# Patient Record
Sex: Female | Born: 1980 | Race: Black or African American | Hispanic: No | Marital: Married | Smoking: Never smoker
Health system: Southern US, Community
[De-identification: ages and names within clinical notes are randomized; demographics above are authoritative.]

## PROBLEM LIST (undated history)

## (undated) DIAGNOSIS — R569 Unspecified convulsions: Secondary | ICD-10-CM

## (undated) DIAGNOSIS — E669 Obesity, unspecified: Secondary | ICD-10-CM

## (undated) HISTORY — DX: Unspecified convulsions: R56.9

## (undated) HISTORY — DX: Obesity, unspecified: E66.9

---

## 2008-05-06 DIAGNOSIS — O1490 Unspecified pre-eclampsia, unspecified trimester: Secondary | ICD-10-CM

## 2009-01-07 ENCOUNTER — Encounter (INDEPENDENT_AMBULATORY_CARE_PROVIDER_SITE_OTHER): Payer: Self-pay | Admitting: Obstetrics and Gynecology

## 2009-01-07 ENCOUNTER — Inpatient Hospital Stay (HOSPITAL_COMMUNITY): Admission: AD | Admit: 2009-01-07 | Discharge: 2009-01-11 | Payer: Self-pay | Admitting: Obstetrics and Gynecology

## 2009-08-15 ENCOUNTER — Emergency Department (HOSPITAL_COMMUNITY): Admission: EM | Admit: 2009-08-15 | Discharge: 2009-08-15 | Payer: Self-pay | Admitting: Family Medicine

## 2010-07-05 ENCOUNTER — Inpatient Hospital Stay (INDEPENDENT_AMBULATORY_CARE_PROVIDER_SITE_OTHER)
Admission: RE | Admit: 2010-07-05 | Discharge: 2010-07-05 | Disposition: A | Discharge status: Home / Self Care | Payer: Self-pay | Source: Ambulatory Visit | Attending: Family Medicine | Admitting: Family Medicine

## 2010-07-05 ENCOUNTER — Ambulatory Visit (INDEPENDENT_AMBULATORY_CARE_PROVIDER_SITE_OTHER): Payer: Self-pay

## 2010-07-05 DIAGNOSIS — S92919A Unspecified fracture of unspecified toe(s), initial encounter for closed fracture: Secondary | ICD-10-CM

## 2010-08-10 LAB — URIC ACID
Uric Acid, Serum: 6.5 mg/dL (ref 2.4–7.0)
Uric Acid, Serum: 6.9 mg/dL (ref 2.4–7.0)

## 2010-08-10 LAB — COMPREHENSIVE METABOLIC PANEL
ALT: 10 U/L (ref 0–35)
AST: 15 U/L (ref 0–37)
AST: 21 U/L (ref 0–37)
Alkaline Phosphatase: 104 U/L (ref 39–117)
Alkaline Phosphatase: 142 U/L — ABNORMAL HIGH (ref 39–117)
BUN: 6 mg/dL (ref 6–23)
CO2: 24 mEq/L (ref 19–32)
CO2: 25 mEq/L (ref 19–32)
CO2: 27 mEq/L (ref 19–32)
Calcium: 8.3 mg/dL — ABNORMAL LOW (ref 8.4–10.5)
Calcium: 8.3 mg/dL — ABNORMAL LOW (ref 8.4–10.5)
Chloride: 107 mEq/L (ref 96–112)
Creatinine, Ser: 0.78 mg/dL (ref 0.4–1.2)
GFR calc Af Amer: >60 mL/min (ref >60)
GFR calc Af Amer: >60 mL/min (ref >60)
GFR calc non Af Amer: >60 mL/min (ref >60)
GFR calc non Af Amer: >60 mL/min (ref >60)
GFR calc non Af Amer: >60 mL/min (ref >60)
Glucose, Bld: 109 mg/dL — ABNORMAL HIGH (ref 70–99)
Glucose, Bld: 81 mg/dL (ref 70–99)
Glucose, Bld: 90 mg/dL (ref 70–99)
Potassium: 3.7 mEq/L (ref 3.5–5.1)
Sodium: 138 mEq/L (ref 135–145)
Total Bilirubin: 0.4 mg/dL (ref 0.3–1.2)
Total Protein: 4.9 g/dL — ABNORMAL LOW (ref 6.0–8.3)

## 2010-08-10 LAB — LACTATE DEHYDROGENASE
LDH: 116 U/L (ref 94–250)
LDH: 124 U/L (ref 94–250)
LDH: 162 U/L (ref 94–250)

## 2010-08-10 LAB — MAGNESIUM: Magnesium: 5.3 mg/dL — ABNORMAL HIGH (ref 1.5–2.5)

## 2010-08-10 LAB — URINALYSIS, MICROSCOPIC ONLY
Ketones, ur: 15 mg/dL — AB
Leukocytes, UA: NEGATIVE
Nitrite: NEGATIVE
Protein, ur: 30 mg/dL — AB
Urobilinogen, UA: >8 mg/dL — ABNORMAL HIGH (ref 0.0–1.0)

## 2010-08-10 LAB — CBC
HCT: 30.6 % — ABNORMAL LOW (ref 36.0–46.0)
HCT: 33.4 % — ABNORMAL LOW (ref 36.0–46.0)
HCT: 37.7 % (ref 36.0–46.0)
Hemoglobin: 10.2 g/dL — ABNORMAL LOW (ref 12.0–15.0)
Hemoglobin: 11.2 g/dL — ABNORMAL LOW (ref 12.0–15.0)
Hemoglobin: 12.7 g/dL (ref 12.0–15.0)
MCHC: 33.5 g/dL (ref 30.0–36.0)
MCHC: 33.6 g/dL (ref 30.0–36.0)
MCHC: 33.9 g/dL (ref 30.0–36.0)
MCV: 88.3 fL (ref 78.0–100.0)
Platelets: 200 10*3/uL (ref 150–400)
RBC: 3.4 MIL/uL — ABNORMAL LOW (ref 3.87–5.11)
RBC: 4.33 MIL/uL (ref 3.87–5.11)
RDW: 14.7 % (ref 11.5–15.5)
RDW: 14.8 % (ref 11.5–15.5)
RDW: 15.3 % (ref 11.5–15.5)
WBC: 11.3 10*3/uL — ABNORMAL HIGH (ref 4.0–10.5)
WBC: 14.6 10*3/uL — ABNORMAL HIGH (ref 4.0–10.5)

## 2010-08-10 LAB — BASIC METABOLIC PANEL
BUN: 7 mg/dL (ref 6–23)
Calcium: 8.1 mg/dL — ABNORMAL LOW (ref 8.4–10.5)
GFR calc non Af Amer: >60 mL/min (ref >60)
Potassium: 3.5 mEq/L (ref 3.5–5.1)

## 2010-08-10 LAB — PROTEIN, URINE, RANDOM: Total Protein, Urine: 6 mg/dL

## 2012-05-01 ENCOUNTER — Emergency Department (INDEPENDENT_AMBULATORY_CARE_PROVIDER_SITE_OTHER)
Admission: EM | Admit: 2012-05-01 | Discharge: 2012-05-01 | Disposition: A | Discharge status: Home / Self Care | Payer: Self-pay | Source: Home / Self Care | Attending: Family Medicine | Admitting: Family Medicine

## 2012-05-01 ENCOUNTER — Encounter (HOSPITAL_COMMUNITY): Payer: Self-pay

## 2012-05-01 DIAGNOSIS — T23269A Burn of second degree of back of unspecified hand, initial encounter: Secondary | ICD-10-CM

## 2012-05-01 MED ORDER — HYDROCODONE-ACETAMINOPHEN 5-325 MG PO TABS: 1.0000 | ORAL_TABLET | ORAL | Status: DC | PRN

## 2012-05-01 MED ORDER — SILVER SULFADIAZINE 1 % EX CREA: TOPICAL_CREAM | Freq: Every day | CUTANEOUS | Status: DC
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
## 2012-09-01 ENCOUNTER — Emergency Department (HOSPITAL_COMMUNITY)
Admission: EM | Admit: 2012-09-01 | Discharge: 2012-09-01 | Disposition: A | Discharge status: Home / Self Care | Payer: Self-pay | Attending: Emergency Medicine | Admitting: Emergency Medicine

## 2012-09-01 ENCOUNTER — Encounter (HOSPITAL_COMMUNITY): Payer: Self-pay | Admitting: Emergency Medicine

## 2012-09-01 DIAGNOSIS — R569 Unspecified convulsions: Secondary | ICD-10-CM | POA: Insufficient documentation

## 2012-09-01 DIAGNOSIS — Z3202 Encounter for pregnancy test, result negative: Secondary | ICD-10-CM | POA: Insufficient documentation

## 2012-09-01 DIAGNOSIS — Z880 Allergy status to penicillin: Secondary | ICD-10-CM | POA: Insufficient documentation

## 2012-09-01 LAB — URINALYSIS, ROUTINE W REFLEX MICROSCOPIC
Glucose, UA: NEGATIVE mg/dL
Leukocytes, UA: NEGATIVE
Protein, ur: 30 mg/dL — AB
Specific Gravity, Urine: 1.017 (ref 1.005–1.030)
Urobilinogen, UA: 0.2 mg/dL (ref 0.0–1.0)

## 2012-09-01 LAB — POCT PREGNANCY, URINE: Preg Test, Ur: NEGATIVE

## 2012-09-01 LAB — BASIC METABOLIC PANEL
CO2: 27 mEq/L (ref 19–32)
Calcium: 9.4 mg/dL (ref 8.4–10.5)
GFR calc non Af Amer: >90 mL/min (ref >90)
Sodium: 135 mEq/L (ref 135–145)

## 2012-09-01 LAB — CBC WITH DIFFERENTIAL/PLATELET
Lymphocytes Relative: 18 % (ref 12–46)
Lymphs Abs: 1.9 10*3/uL (ref 0.7–4.0)
Neutrophils Relative %: 75 % (ref 43–77)
Platelets: 324 10*3/uL (ref 150–400)
RBC: 5.29 MIL/uL — ABNORMAL HIGH (ref 3.87–5.11)
WBC: 10.7 10*3/uL — ABNORMAL HIGH (ref 4.0–10.5)

## 2012-09-01 LAB — URINE MICROSCOPIC-ADD ON

## 2012-09-01 NOTE — ED Notes (Addendum)
Pt brought to ED via [facility] for evaluation of possible seizure.  Witnessed by family pt stood up from couch and then fell backwards and put her arms infront of her- family reports pt seized for about 2 minutes- pt has no history of seizures.  EMS reports pt to be post-ictal for about 30 minutes after seizure.  Pt reports feeling tired earlier today, does not remember incident prior to arrival.  IV in place, seizure precautions in place.  Dr. Jeraldine Loots at bedside.  Pt states she has been staying up late studying for finals.  Alert and oriented X 4 at present.
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
## 2012-09-03 ENCOUNTER — Ambulatory Visit (INDEPENDENT_AMBULATORY_CARE_PROVIDER_SITE_OTHER): Payer: Self-pay | Admitting: Neurology

## 2012-09-03 ENCOUNTER — Telehealth: Payer: Self-pay | Admitting: Neurology

## 2012-09-03 ENCOUNTER — Encounter: Payer: Self-pay | Admitting: Neurology

## 2012-09-03 ENCOUNTER — Other Ambulatory Visit (INDEPENDENT_AMBULATORY_CARE_PROVIDER_SITE_OTHER): Payer: Self-pay

## 2012-09-03 VITALS — BP 140/99 | HR 69 | Ht 59.0 in | Wt 194.0 lb

## 2012-09-03 DIAGNOSIS — R569 Unspecified convulsions: Secondary | ICD-10-CM

## 2012-09-03 DIAGNOSIS — Z0289 Encounter for other administrative examinations: Secondary | ICD-10-CM

## 2012-09-03 HISTORY — DX: Unspecified convulsions: R56.9

## 2012-09-03 MED ORDER — LEVETIRACETAM 500 MG PO TABS: 500.0000 mg | ORAL_TABLET | Freq: Two times a day (BID) | ORAL | Status: DC

## 2012-09-03 MED ORDER — LEVETIRACETAM 500 MG PO TABS: ORAL_TABLET | ORAL | Status: DC

## 2012-09-03 NOTE — Procedures (Signed)
Anne Benjamin is a 32 year old patient with a single seizure event that occurred as a witnessed event on 09/01/2012. The event occurred after the patient had been up most of the night before studying. The patient is being evaluated for the seizures.  This is a routine EEG. No skull defects are noted. Medications include multivitamins.  EEG classification: Dysrhythmia grade 2 left temporal  Description of the recording: The background rhythms of this recording consists of a fairly well modulated medium amplitude alpha rhythm of 8 Hz that is reactive to eye opening and closure. As the record progresses, the patient initially was in the waking state, and photic stimulation is performed. This results in a bilateral and symmetric photic driving response. Hyperventilation is also performed, and this results in a minimal buildup of the background rhythm activities without significant slowing seen. Towards the end of the recording, the patient appears to enter the drowsy state, with a dropout of the background rhythm activities and generalized 7 Hz frequency slowing seen. The patient never enters stage II sleep. Intermittently during the recording, dysrhythmic theta slowing is seen emanating from the left mid temporal region, with occasional sharp transients. At no time during the recording does there appear to be evidence of spike or spike wave discharges. EKG monitor reveals no evidence of cardiac rhythm abnormalities with a heart rate of 78.  Impression: This is an abnormal EEG recording secondary to left temporal dysrhythmic theta activity. 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 No electrographic seizures were recorded.

## 2012-09-03 NOTE — Telephone Encounter (Signed)
I called and I talked with the husband. The EEG study showed left brain irritability. The patient will be placed on Keppra. Eventually, a MRI the brain will be done. Unfortunately, they will be leading to go out of the country for 3 months. I have called in a 90 day prescription for the Keppra.

## 2012-09-03 NOTE — Patient Instructions (Addendum)
Seizure, Adult  A seizure is abnormal electrical activity in the brain. Seizures can cause a change in attention or behavior (altered mental status). Seizures often involve uncontrollable shaking (convulsions). Seizures usually last from 30 seconds to 2 minutes. Epilepsy is a brain disorder in which a patient has repeated seizures over time.  CAUSES   There are many different problems that can cause seizures. In some cases, no cause is found. Common causes of seizures include:   Head injuries.   Brain tumors.   Infections.   Imbalance of chemicals in the blood.   Kidney failure or liver failure.   Heart disease.   Drug abuse.   Stroke.   Withdrawal from certain drugs or alcohol.   Birth defects.   Malfunction of a neurosurgical device placed in the brain.  SYMPTOMS   Symptoms vary depending on the part of the brain that is involved. Right before a seizure, you may have a warning (aura) that a seizure is about to occur. An aura may include the following symptoms:    Fear or anxiety.   Nausea.   Feeling like the room is spinning (vertigo).   Vision changes, such as seeing flashing lights or spots.  Common symptoms during a seizure include:   Convulsions.   Drooling.   Rapid eye movements.   Grunting.   Loss of bladder and bowel control.   Bitter taste in the mouth.  After a seizure, you may feel confused and sleepy. You may also have an injury resulting from convulsions during the seizure.  DIAGNOSIS   Your caregiver will perform a physical exam and run some tests to determine the type and cause of your seizure. These tests may include:   Blood tests.   A lumbar puncture test. In this test, a small amount of fluid is removed from the spine and examined.   Electrocardiography (ECG). This test records the electrical activity in your heart.   Imaging tests, such as computed tomography (CT) scans or magnetic resonance imaging (MRI).   Electroencephalography (EEG). This test records the electrical  activity in your brain.  TREATMENT   Seizures usually stop on their own. Treatment will depend on the cause of your seizure. In some cases, medicine may be given to prevent future seizures.  HOME CARE INSTRUCTIONS    If you are given medicines, take them exactly as prescribed by your caregiver.   Keep all follow-up appointments as directed by your caregiver.   Do not swim or drive until your caregiver says it is okay.   Teach friends and family what to do if you have a seizure. They should:   Lay you on the ground to prevent a fall.   Put a cushion under your head.   Loosen any tight clothing around your neck.   Turn you on your side. If vomiting occurs, this helps keep your airway clear.   Stay with you until you recover.  SEEK IMMEDIATE MEDICAL CARE IF:   The seizure lasts longer than 2 to 5 minutes.   The seizure is severe or the person does not wake up after the seizure.   The person has altered mental status.  Drive the person to the emergency department or call your local emergency services (911 in [place]  MAKE SURE YOU:   Understand these instructions.   Will watch your condition.   Will get help right away if you are not doing well or get worse.  Document Released: 04/19/2000 Document Revised: 07/15/2011 Document   Reviewed: 04/10/2011  [employer] Patient Information 2013 [employer]
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
## 2013-01-27 ENCOUNTER — Other Ambulatory Visit: Payer: Self-pay

## 2013-02-04 ENCOUNTER — Other Ambulatory Visit: Payer: Self-pay

## 2013-02-10 ENCOUNTER — Ambulatory Visit: Payer: Self-pay | Admitting: Neurology

## 2013-02-22 ENCOUNTER — Other Ambulatory Visit: Payer: Self-pay

## 2014-02-24 ENCOUNTER — Emergency Department (HOSPITAL_COMMUNITY): Payer: 59

## 2014-02-24 ENCOUNTER — Encounter (HOSPITAL_COMMUNITY): Payer: Self-pay | Admitting: Emergency Medicine

## 2014-02-24 ENCOUNTER — Inpatient Hospital Stay (HOSPITAL_COMMUNITY)
Admission: EM | Admit: 2014-02-24 | Discharge: 2014-02-27 | DRG: 101 | Disposition: A | Discharge status: Home / Self Care | Payer: 59 | Attending: Internal Medicine | Admitting: Internal Medicine

## 2014-02-24 DIAGNOSIS — E669 Obesity, unspecified: Secondary | ICD-10-CM | POA: Diagnosis present

## 2014-02-24 DIAGNOSIS — A419 Sepsis, unspecified organism: Secondary | ICD-10-CM

## 2014-02-24 DIAGNOSIS — Z79899 Other long term (current) drug therapy: Secondary | ICD-10-CM | POA: Diagnosis not present

## 2014-02-24 DIAGNOSIS — R569 Unspecified convulsions: Secondary | ICD-10-CM

## 2014-02-24 DIAGNOSIS — R400 Somnolence: Secondary | ICD-10-CM

## 2014-02-24 DIAGNOSIS — Z6841 Body Mass Index (BMI) 40.0 and over, adult: Secondary | ICD-10-CM | POA: Diagnosis not present

## 2014-02-24 DIAGNOSIS — Z9119 Patient's noncompliance with other medical treatment and regimen: Secondary | ICD-10-CM | POA: Diagnosis present

## 2014-02-24 DIAGNOSIS — Z789 Other specified health status: Secondary | ICD-10-CM

## 2014-02-24 DIAGNOSIS — R4182 Altered mental status, unspecified: Secondary | ICD-10-CM

## 2014-02-24 DIAGNOSIS — G40901 Epilepsy, unspecified, not intractable, with status epilepticus: Secondary | ICD-10-CM | POA: Diagnosis present

## 2014-02-24 DIAGNOSIS — R509 Fever, unspecified: Secondary | ICD-10-CM

## 2014-02-24 LAB — URINALYSIS, ROUTINE W REFLEX MICROSCOPIC
Bilirubin Urine: NEGATIVE
GLUCOSE, UA: NEGATIVE mg/dL
KETONES UR: 40 mg/dL — AB
Leukocytes, UA: NEGATIVE
Nitrite: NEGATIVE
PROTEIN: NEGATIVE mg/dL
Specific Gravity, Urine: 1.021 (ref 1.005–1.030)
UROBILINOGEN UA: 0.2 mg/dL (ref 0.0–1.0)
pH: 5 (ref 5.0–8.0)

## 2014-02-24 LAB — BASIC METABOLIC PANEL
Anion gap: 15 (ref 5–15)
BUN: 8 mg/dL (ref 6–23)
CHLORIDE: 100 meq/L (ref 96–112)
CO2: 22 mEq/L (ref 19–32)
Calcium: 9.2 mg/dL (ref 8.4–10.5)
Creatinine, Ser: 0.74 mg/dL (ref 0.50–1.10)
GFR calc Af Amer: >90 mL/min (ref >90)
GLUCOSE: 114 mg/dL — AB (ref 70–99)
POTASSIUM: 4.1 meq/L (ref 3.7–5.3)
SODIUM: 137 meq/L (ref 137–147)

## 2014-02-24 LAB — RAPID URINE DRUG SCREEN, HOSP PERFORMED
AMPHETAMINES: NOT DETECTED
BENZODIAZEPINES: NOT DETECTED
Barbiturates: NOT DETECTED
Cocaine: NOT DETECTED
OPIATES: NOT DETECTED
TETRAHYDROCANNABINOL: NOT DETECTED

## 2014-02-24 LAB — CBC
HCT: 45 % (ref 36.0–46.0)
HEMOGLOBIN: 15.2 g/dL — AB (ref 12.0–15.0)
MCH: 27.8 pg (ref 26.0–34.0)
MCHC: 33.8 g/dL (ref 30.0–36.0)
MCV: 82.4 fL (ref 78.0–100.0)
PLATELETS: 341 10*3/uL (ref 150–400)
RBC: 5.46 MIL/uL — AB (ref 3.87–5.11)
RDW: 14.3 % (ref 11.5–15.5)
WBC: 17.2 10*3/uL — AB (ref 4.0–10.5)

## 2014-02-24 LAB — CBG MONITORING, ED: Glucose-Capillary: 116 mg/dL — ABNORMAL HIGH (ref 70–99)

## 2014-02-24 LAB — URINE MICROSCOPIC-ADD ON

## 2014-02-24 LAB — PREGNANCY, URINE: Preg Test, Ur: NEGATIVE

## 2014-02-24 MED ORDER — SODIUM CHLORIDE 0.9 % IV SOLN
1000.0000 mg | Freq: Once | INTRAVENOUS | Status: AC
  Administered 2014-02-24: 1000 mg via INTRAVENOUS
  Filled 2014-02-24: qty 20

## 2014-02-24 MED ORDER — LORAZEPAM 2 MG/ML IJ SOLN
2.0000 mg | Freq: Once | INTRAMUSCULAR | Status: AC
  Administered 2014-02-24: 2 mg via INTRAVENOUS
  Filled 2014-02-24: qty 1

## 2014-02-24 MED ORDER — FENTANYL CITRATE 0.05 MG/ML IJ SOLN
100.0000 ug | Freq: Once | INTRAMUSCULAR | Status: AC
Start: 2014-02-24 — End: 2014-02-24
  Administered 2014-02-24: 25 ug via INTRAVENOUS
  Filled 2014-02-24: qty 2

## 2014-02-24 MED ORDER — PIPERACILLIN-TAZOBACTAM 3.375 G IVPB 30 MIN
3.3750 g | Freq: Once | INTRAVENOUS | Status: AC
  Administered 2014-02-24: 3.375 g via INTRAVENOUS
  Filled 2014-02-24: qty 50

## 2014-02-24 MED ORDER — DEXTROSE 5 % IV SOLN
10.0000 mg/kg | Freq: Once | INTRAVENOUS | Status: AC
  Administered 2014-02-25: 455 mg via INTRAVENOUS
  Filled 2014-02-24 (×2): qty 9.1

## 2014-02-24 MED ORDER — ACETAMINOPHEN 325 MG PO TABS
650.0000 mg | ORAL_TABLET | Freq: Once | ORAL | Status: AC
  Administered 2014-02-24: 650 mg via ORAL
  Filled 2014-02-24: qty 2

## 2014-02-24 MED ORDER — VANCOMYCIN HCL IN DEXTROSE 1-5 GM/200ML-% IV SOLN
1000.0000 mg | Freq: Once | INTRAVENOUS | Status: AC
Start: 2014-02-24 — End: 2014-02-24
  Administered 2014-02-24: 1000 mg via INTRAVENOUS
  Filled 2014-02-24: qty 200

## 2014-02-24 NOTE — ED Notes (Signed)
MD informed of pt's temperature. LP tray to bedside.

## 2014-02-24 NOTE — ED Notes (Signed)
Dr Stewart at bedside.

## 2014-02-24 NOTE — ED Notes (Signed)
MD at bedside. 

## 2014-02-24 NOTE — ED Notes (Signed)
Neuro at BS.

## 2014-02-24 NOTE — H&P (Signed)
[facility]                                                                                    Patient Demographics  Anne Benjamin, is a 33 y.o. female  CSN: [id number]  MRN: [id number]  DOB - January 26, 1981  Admit Date - 02/24/2014  Outpatient Primary MD for the patient is No PCP Per Patient   With History of -  Past Medical History  Diagnosis Date  . Seizures   . Convulsions/seizures 09/03/2012  . Obesity       Past Surgical History  Procedure Laterality Date  . Cesarean section      in for   Chief Complaint  Patient presents with  . Seizures     HPI  Anne Benjamin  is a 33 y.o. female, post medical history significant for seizure disorder on Keppra, presenting with 4 episodes of witnessed seizures that started this afternoon. The patient was diagnosed to have seizures few years ago and was started on Keppra 500 mg twice a day however she cut the dose in 1/4 and was taken 250 mg by mouth daily due to intolerance. Patient is still groggy and did not wake up completely in the emergency room yet. In the emergency room the patient was noted to have fever and leukocytosis so an LP was attempted and unsuccessful and an LP was ordered by radiology . Patient was started on IV antibiotics and anti-viral medications and was placed in isolation . Neurology was consulted    Review of Systems    Unable to obtain due to patient's condition  Social History History  Substance Use Topics  . Smoking status: Never Smoker   . Smokeless tobacco: Not on file  . Alcohol Use: No     Comment: rare     Family History Family History  Problem Relation Age of Onset  . Hypertension Mother      Prior to Admission medications   Medication Sig Start Date End Date Taking? Authorizing Provider  levETIRAcetam (KEPPRA) 500 MG tablet Take 1 tablet (500 mg total) by mouth 2 (two) times daily. 09/03/12  Yes Kathrynn Ducking, MD  Multiple Vitamin (MULTIVITAMIN) tablet Take 1 tablet by  mouth daily.   Yes Historical Provider, MD    Allergies  Allergen Reactions  . Orange Oil     unknown  . Penicillins     unknown    Physical Exam  Vitals  Blood pressure 109/70, pulse 119, temperature 101.2 F (38.4 C), temperature source Oral, resp. rate 23, height 5' (1.524 m), weight 81.647 kg (180 lb), last menstrual period 01/27/2014, SpO2 95.00%.   1. General Young female, well developed, well nourished, drowsy but arousable  2. confused.  3. No F.N deficits grossly, ALL C.Nerves Intact,  4. Ears and Eyes appear Normal, Conjunctivae clear, PERRLA. Moist Oral Mucosa.  5. Supple Neck, No JVD, No cervical lymphadenopathy appriciated, No Carotid Bruits.  6. Symmetrical Chest wall movement, Good air movement bilaterally, CTAB.  7. RRR, tachycardic No Gallops, Rubs or Murmurs, No Parasternal Heave.  8. Positive Bowel Sounds, Abdomen Soft, Non tender, No organomegaly appriciated,No rebound -guarding or rigidity.  9.  No Cyanosis, Normal Skin Turgor, No Skin Rash or Bruise.  10. Good muscle tone,  joints appear normal , no effusions, Normal ROM.  11. No Palpable Lymph Nodes in Neck or Axillae    Data Review  CBC  Recent Labs Lab 02/24/14 1735  WBC 17.2*  HGB 15.2*  HCT 45.0  PLT 341  MCV 82.4  MCH 27.8  MCHC 33.8  RDW 14.3   ------------------------------------------------------------------------------------------------------------------  Chemistries   Recent Labs Lab 02/24/14 1735  NA 137  K 4.1  CL 100  CO2 22  GLUCOSE 114*  BUN 8  CREATININE 0.74  CALCIUM 9.2   ------------------------------------------------------------------------------------------------------------------ estimated creatinine clearance is 92.8 ml/min (by C-G formula based on Cr of 0.74). ------------------------------------------------------------------------------------------------------------------ No results found for this basename: TSH, T4TOTAL, FREET3, T3FREE,  THYROIDAB,  in the last 72 hours   Coagulation profile No results found for this basename: INR, PROTIME,  in the last 168 hours ------------------------------------------------------------------------------------------------------------------- No results found for this basename: DDIMER,  in the last 72 hours -------------------------------------------------------------------------------------------------------------------  Cardiac Enzymes No results found for this basename: CK, CKMB, TROPONINI, MYOGLOBIN,  in the last 168 hours ------------------------------------------------------------------------------------------------------------------ No components found with this basename: POCBNP,    ---------------------------------------------------------------------------------------------------------------  Urinalysis    Component Value Date/Time   COLORURINE YELLOW 02/24/2014 2033   APPEARANCEUR CLOUDY* 02/24/2014 2033   LABSPEC 1.021 02/24/2014 2033   PHURINE 5.0 02/24/2014 2033   [place] 02/24/2014 2033   HGBUR TRACE* 02/24/2014 2033   [place] NEGATIVE 02/24/2014 2033   KETONESUR 40* 02/24/2014 2033   PROTEINUR NEGATIVE 02/24/2014 2033   UROBILINOGEN 0.2 02/24/2014 2033   NITRITE NEGATIVE 02/24/2014 2033   LEUKOCYTESUR NEGATIVE 02/24/2014 2033    ----------------------------------------------------------------------------------------------------------------  .   Imaging results:   Ct Head Wo Contrast  02/24/2014   CLINICAL DATA:  33 year old female with for seizures today and fever. Initial encounter.  EXAM: CT HEAD WITHOUT CONTRAST  TECHNIQUE: Contiguous axial images were obtained from the base of the skull through the vertex without intravenous contrast.  COMPARISON:  None.  FINDINGS: A trace bubbly opacity in the right sphenoid sinus. Other Visualized paranasal sinuses and mastoids are clear. No osseous abnormality identified. Dysconjugate gaze, otherwise  negative orbits soft tissues. Visualized scalp soft tissues are within normal limits.  Cerebral volume is normal. No midline shift, ventriculomegaly, mass effect, evidence of mass lesion, intracranial hemorrhage or evidence of cortically based acute infarction. Gray-white matter differentiation is within normal limits throughout the brain. No suspicious intracranial vascular hyperdensity.  IMPRESSION: Normal noncontrast CT appearance of the brain.   Electronically Signed   By: Lars Pinks M.D.   On: 02/24/2014 21:17    My personal review of EKG: Sinus tachycardia at 133 beats per minute with no acute changes    Assessment & Plan  1. seizure/status epilepticus 2. Fever 3. Noncompliance  Plan  Lumbar puncture By the radiology and follow results IV antibiotics with IV acyclovir Keep in isolation IV Dilantin IV Ativan when necessary Neurology following   DVT Prophylaxis  Lovenox after LP  AM Labs Ordered, also please review Full Orders  Family Communication: Admission, patients condition and plan of care including tests being ordered have been discussed with the  husband  who indicates understanding and agrese with the plan and Code Status.  Code Statusfull  Disposition Plan:Home  Time spent in minutes : 35 minutes  Condition  critical   @SIGNATURE @

## 2014-02-24 NOTE — ED Notes (Addendum)
Pt reports she has an allergy to Penicillin and it causes nausea and vomiting. This RN called pharmacy to ensure Zosyn and Vancomycin are okay to give due to sensitivity to PCN. Pharmacy reported it was okay to administer to patient. Pt reported she does NOT experience any throat swelling or closing if she takes PCN.

## 2014-02-24 NOTE — ED Notes (Signed)
Family states pt had 4 seizures today.  Hx of such.  Last in July. Pt has been taking 0.5 pill of Leveiracetam once a day.  1st seizure at 0430, last seizure at 1530.  Pt ao x 4.  Pt states she is exhausted, but denies pain.

## 2014-02-24 NOTE — ED Notes (Signed)
Admitting MD at BS.  

## 2014-02-24 NOTE — ED Provider Notes (Signed)
CSN: [id number]     Arrival date & time 02/24/14  1700 History   First MD Initiated Contact with Patient 02/24/14 1728     Chief Complaint  Patient presents with  . Seizures     (Consider location/radiation/quality/duration/timing/severity/associated sxs/prior Treatment) HPI Comments: 33 year old female with a history of seizures over the last year, she has been on Keppra, she is only taking 250 mg once a day. She states that over the course of the last 24 hours she has had 4 separate tonic-clonic seizures each lasting several minutes and resolving spontaneously. She has associated tongue biting and some urinary incontinence and now has a post ictal phase with a headache. There has been no other symptoms including no fevers chills nausea vomiting diarrhea swelling rashes coughing shortness of breath. She has not missed any of her medications. She reports that she started taking Keppra approximately one year ago but did not likely made her feel so she tapered back to 250 mg once a day.  Patient is a 33 y.o. female presenting with seizures. The history is provided by the patient.  Seizures   Past Medical History  Diagnosis Date  . Seizures   . Convulsions/seizures 09/03/2012  . Obesity    Past Surgical History  Procedure Laterality Date  . Cesarean section     Family History  Problem Relation Age of Onset  . Hypertension Mother    History  Substance Use Topics  . Smoking status: Never Smoker   . Smokeless tobacco: Not on file  . Alcohol Use: No     Comment: rare   OB History   Grav Para Term Preterm Abortions TAB SAB Ect Mult Living                 Review of Systems  Neurological: Positive for seizures.  All other systems reviewed and are negative.     Allergies  Orange oil and Penicillins  Home Medications   Prior to Admission medications   Medication Sig Start Date End Date Taking? Authorizing Provider  levETIRAcetam (KEPPRA) 500 MG tablet Take 1 tablet (500 mg  total) by mouth 2 (two) times daily. 09/03/12  Yes Kathrynn Ducking, MD  Multiple Vitamin (MULTIVITAMIN) tablet Take 1 tablet by mouth daily.   Yes Historical Provider, MD   BP 116/71  Pulse 127  Temp(Src) 101.2 F (38.4 C) (Oral)  Resp 21  Ht 5' (1.524 m)  Wt 180 lb (81.647 kg)  BMI 35.15 kg/m2  SpO2 99%  LMP 01/27/2014 Physical Exam  Nursing note and vitals reviewed. Constitutional: She appears well-developed and well-nourished. No distress.  HENT:  Head: Normocephalic and atraumatic.  Mouth/Throat: Oropharynx is clear and moist. No oropharyngeal exudate.  Evidence of biting to the distal tongue on the right, teeth intact  Eyes: Conjunctivae and EOM are normal. Pupils are equal, round, and reactive to light. Right eye exhibits no discharge. Left eye exhibits no discharge. No scleral icterus.  Neck: Normal range of motion. Neck supple. No JVD present. No thyromegaly present.  Cardiovascular: Regular rhythm, normal heart sounds and intact distal pulses.  Exam reveals no gallop and no friction rub.   No murmur heard. Mild tachycardia  Pulmonary/Chest: Effort normal and breath sounds normal. No respiratory distress. She has no wheezes. She has no rales.  Abdominal: Soft. Bowel sounds are normal. She exhibits no distension and no mass. There is no tenderness.  Musculoskeletal: Normal range of motion. She exhibits no edema and no tenderness.  Lymphadenopathy:  She has no cervical adenopathy.  Neurological: She is alert. Coordination normal.  Speech is clear, cranial nerves III through XII are intact, memory is intact, strength is normal in all 4 extremities including grips, sensation is intact to light touch and pinprick in all 4 extremities. Coordination as tested by finger-nose-finger is normal, no limb ataxia.  normal reflexes at the patellar tendons bilaterally  Skin: Skin is warm and dry. No rash noted. No erythema.  Psychiatric: She has a normal mood and affect. Her behavior is  normal.    ED Course  LUMBAR PUNCTURE Date/Time: 02/24/2014 10:27 PM Performed by: Noemi Chapel D Authorized by: Noemi Chapel D Consent: Verbal consent obtained. written consent obtained. Risks and benefits: risks, benefits and alternatives were discussed Consent given by: spouse Patient understanding: patient states understanding of the procedure being performed Patient consent: the patient's understanding of the procedure matches consent given Required items: required blood products, implants, devices, and special equipment available Patient identity confirmed: arm band Time out: Immediately prior to procedure a "time out" was called to verify the correct patient, procedure, equipment, support staff and site/side marked as required. Indications: evaluation for infection and evaluation for altered mental status Anesthesia: local infiltration Local anesthetic: lidocaine 1% with epinephrine Anesthetic total: 10 ml Patient sedated: no Preparation: Patient was prepped and draped in the usual sterile fashion. Lumbar space: L3-L4 interspace Patient's position: left lateral decubitus Needle gauge: 20 Needle length: 3.5 in Number of attempts: 3 Post-procedure: site cleaned Patient tolerance: Patient tolerated the procedure well with no immediate complications. Comments: Unsuccessful attempt, no fluid obtained   (including critical care time) Labs Review Labs Reviewed  BASIC METABOLIC PANEL - Abnormal; Notable for the following:    Glucose, Bld 114 (*)    All other components within normal limits  CBC - Abnormal; Notable for the following:    WBC 17.2 (*)    RBC 5.46 (*)    Hemoglobin 15.2 (*)    All other components within normal limits  URINALYSIS, ROUTINE W REFLEX MICROSCOPIC - Abnormal; Notable for the following:    APPearance CLOUDY (*)    Hgb urine dipstick TRACE (*)    Ketones, ur 40 (*)    All other components within normal limits  URINE MICROSCOPIC-ADD ON - Abnormal;  Notable for the following:    Crystals URIC ACID CRYSTALS (*)    All other components within normal limits  CBG MONITORING, ED - Abnormal; Notable for the following:    Glucose-Capillary 116 (*)    All other components within normal limits  CSF CULTURE  GRAM STAIN  URINE RAPID DRUG SCREEN (HOSP PERFORMED)  PREGNANCY, URINE  CSF CELL COUNT WITH DIFFERENTIAL  CSF CELL COUNT WITH DIFFERENTIAL  HERPES SIMPLEX VIRUS(HSV) DNA BY PCR  CRYPTOCOCCAL ANTIGEN, CSF  VDRL, CSF  PROTEIN AND GLUCOSE, CSF    Imaging Review Ct Head Wo Contrast  02/24/2014   CLINICAL DATA:  33 year old female with for seizures today and fever. Initial encounter.  EXAM: CT HEAD WITHOUT CONTRAST  TECHNIQUE: Contiguous axial images were obtained from the base of the skull through the vertex without intravenous contrast.  COMPARISON:  None.  FINDINGS: A trace bubbly opacity in the right sphenoid sinus. Other Visualized paranasal sinuses and mastoids are clear. No osseous abnormality identified. Dysconjugate gaze, otherwise negative orbits soft tissues. Visualized scalp soft tissues are within normal limits.  Cerebral volume is normal. No midline shift, ventriculomegaly, mass effect, evidence of mass lesion, intracranial hemorrhage or evidence of cortically based  acute infarction. Gray-white matter differentiation is within normal limits throughout the brain. No suspicious intracranial vascular hyperdensity.  IMPRESSION: Normal noncontrast CT appearance of the brain.   Electronically Signed   By: Lars Pinks M.D.   On: 02/24/2014 21:17     EKG Interpretation   Date/Time:  Thursday February 24 2014 17:13:16 EDT Ventricular Rate:  118 PR Interval:  146 QRS Duration: 72 QT Interval:  320 QTC Calculation: 448 R Axis:   78 Text Interpretation:  Sinus tachycardia Nonspecific T wave abnormality  Abnormal ECG No old tracing to compare Confirmed by Lynita Groseclose  MD, [place]  [phone]) on 02/24/2014 5:29:12 PM      MDM   Final  diagnoses:  Seizure  Altered mental status  Sepsis, due to unspecified organism    The patient has had multiple seizures today, she is tachycardic, she has a normal mental status, at this time the patient will need Ativan, likely admission to the hospital and will need exploration with other drugs incentive Keppra as she did not like the side effects. She has not had a seizure at this time.  The patient remained without any more seizures, her pulse has remained tachycardic between 120 and 130. She is able to be awakened from sleep and is able follow commands and talk to her baseline. She does appear sleepy but this is after Ativan and multiple seizures today. Will recheck temperature from rectal location, check urinalysis and urine drug screen, discussed with neuro hospitalist Dr. Nicole Kindred who agrees with admission to the hospital, Dilantin load  Attempted lumbar puncture, unsuccessful, needle apparently not long enough for procedure. Discussed with radiology, Dr. Gerilyn Nestle will attempt under fluoroscopy  The patient is critically ill, unknown source of ongoing seizures, multiple re-evaluations without a decline in mental status.  D/W Dr. Laren Everts who will admit to hospitalist service.   CRITICAL CARE Performed by: Johnna Acosta Total critical care time: 35 Critical care time was exclusive of separately billable procedures and treating other patients. Critical care was necessary to treat or prevent imminent or life-threatening deterioration. Critical care was time spent personally by me on the following activities: development of treatment plan with patient and/or surrogate as well as nursing, discussions with consultants, evaluation of patient's response to treatment, examination of patient, obtaining history from patient or surrogate, ordering and performing treatments and interventions, ordering and review of laboratory studies, ordering and review of radiographic studies, pulse oximetry and  re-evaluation of patient's condition.   Johnna Acosta, MD 02/24/14 [phone]

## 2014-02-24 NOTE — Consult Note (Signed)
Reason for Consult: Recurrent generalized seizures as well as fever.  HPI:                                                                                                                                          Anne Benjamin is an 33 y.o. female with a history of seizure disorder and obesity brought to the emergency room following multiple witnessed generalized seizures. Patient had 4 witnessed seizures starting at about 4:30 AM today. The last seizure occurred at about 4:30 PM. She was prescribed Keppra 500 mg twice a day. She's only been taking 250 mg of Keppra per day because of side effects, including nausea. Today seizure activity is the first time about 2 years. She was noted to have elevated rectal temperature of 101.3. CT scan of her head showed no acute intracranial abnormality. She has experienced a mild pressure sensation in her head but no frank headache. She also denies neck and back pain. WBC count was 17.2. She was initially confused on arrival but has returned to baseline mentally. She was given 1000 mg of Dilantin IV. She said no recurrence of seizure activity.  Past Medical History  Diagnosis Date  . Seizures   . Convulsions/seizures 09/03/2012  . Obesity     Past Surgical History  Procedure Laterality Date  . Cesarean section      Family History  Problem Relation Age of Onset  . Hypertension Mother     Social History:  reports that she has never smoked. She does not have any smokeless tobacco history on file. She reports that she does not drink alcohol or use illicit drugs.  Allergies  Allergen Reactions  . Orange Oil     unknown  . Penicillins     unknown    MEDICATIONS:                                                                                                                     I have reviewed the patient's current medications.   ROS:  History obtained from the patient  General ROS: negative for - chills, fatigue, fever, night sweats, weight gain or weight loss Psychological ROS: negative for - behavioral disorder, hallucinations, memory difficulties, mood swings or suicidal ideation Ophthalmic ROS: negative for - blurry vision, double vision, eye pain or loss of vision ENT ROS: negative for - epistaxis, nasal discharge, oral lesions, sore throat, tinnitus or vertigo Allergy and Immunology ROS: negative for - hives or itchy/watery eyes Hematological and Lymphatic ROS: negative for - bleeding problems, bruising or swollen lymph nodes Endocrine ROS: negative for - galactorrhea, hair pattern changes, polydipsia/polyuria or temperature intolerance Respiratory ROS: negative for - cough, hemoptysis, shortness of breath or wheezing Cardiovascular ROS: negative for - chest pain, dyspnea on exertion, edema or irregular heartbeat Gastrointestinal ROS: negative for - abdominal pain, diarrhea, hematemesis, nausea/vomiting or stool incontinence Genito-Urinary ROS: negative for - dysuria, hematuria, incontinence or urinary frequency/urgency Musculoskeletal ROS: negative for - joint swelling or muscular weakness Neurological ROS: as noted in HPI Dermatological ROS: negative for rash and skin lesion changes   Blood pressure 100/72, pulse 127, temperature 101.2 F (38.4 C), temperature source Rectal, resp. rate 21, height 5' (1.524 m), weight 81.647 kg (180 lb), last menstrual period 01/27/2014, SpO2 99.00%.   Neurologic Examination:                                                                                                      Mental Status: Slightly lethargic, oriented x3, thought content appropriate.  Speech fluent without evidence of aphasia. Able to follow commands without difficulty. Cranial Nerves: II-Visual fields were normal. III/IV/VI-Pupils were equal and reacted. Extraocular movements were full and conjugate.     V/VII-no facial numbness and no facial weakness. VIII-normal. X-normal speech. XII-midline tongue extension Motor: 5/5 bilaterally with normal tone and bulk Sensory: Normal throughout. Deep Tendon Reflexes: 2+ and symmetric. Plantars: Mute bilaterally Cerebellar: Normal finger-to-nose testing.  No results found for this basename: cbc, bmp, coags, chol, tri, ldl, hga1c    Results for orders placed during the hospital encounter of 02/24/14 (from the past 48 hour(s))  CBG MONITORING, ED     Status: Abnormal   Collection Time    02/24/14  5:31 PM      Result Value Ref Range   Glucose-Capillary 116 (*) 70 - 99 mg/dL  BASIC METABOLIC PANEL     Status: Abnormal   Collection Time    02/24/14  5:35 PM      Result Value Ref Range   Sodium 137  137 - 147 mEq/L   Potassium 4.1  3.7 - 5.3 mEq/L   Chloride 100  96 - 112 mEq/L   CO2 22  19 - 32 mEq/L   Glucose, Bld 114 (*) 70 - 99 mg/dL   BUN 8  6 - 23 mg/dL   Creatinine, Ser 0.74  0.50 - 1.10 mg/dL   Calcium 9.2  8.4 - 10.5 mg/dL   GFR calc non Af Amer >90  >90 mL/min   GFR calc Af Amer >90  >90 mL/min   Comment: (NOTE)  The eGFR has been calculated using the CKD EPI equation.     This calculation has not been validated in all clinical situations.     eGFR's persistently <90 mL/min signify possible Chronic Kidney     Disease.   Anion gap 15  5 - 15  CBC     Status: Abnormal   Collection Time    02/24/14  5:35 PM      Result Value Ref Range   WBC 17.2 (*) 4.0 - 10.5 K/uL   RBC 5.46 (*) 3.87 - 5.11 MIL/uL   Hemoglobin 15.2 (*) 12.0 - 15.0 g/dL   HCT 45.0  36.0 - 46.0 %   MCV 82.4  78.0 - 100.0 fL   MCH 27.8  26.0 - 34.0 pg   MCHC 33.8  30.0 - 36.0 g/dL   RDW 14.3  11.5 - 15.5 %   Platelets 341  150 - 400 K/uL  URINALYSIS, ROUTINE W REFLEX MICROSCOPIC     Status: Abnormal   Collection Time    02/24/14  8:33 PM      Result Value Ref Range   Color, Urine YELLOW  YELLOW   APPearance CLOUDY (*) CLEAR   Specific Gravity,  Urine 1.021  1.005 - 1.030   pH 5.0  5.0 - 8.0   Glucose, UA NEGATIVE  NEGATIVE mg/dL   Hgb urine dipstick TRACE (*) NEGATIVE   Bilirubin Urine NEGATIVE  NEGATIVE   Ketones, ur 40 (*) NEGATIVE mg/dL   Protein, ur NEGATIVE  NEGATIVE mg/dL   Urobilinogen, UA 0.2  0.0 - 1.0 mg/dL   Nitrite NEGATIVE  NEGATIVE   Leukocytes, UA NEGATIVE  NEGATIVE  URINE MICROSCOPIC-ADD ON     Status: Abnormal   Collection Time    02/24/14  8:33 PM      Result Value Ref Range   Squamous Epithelial / LPF RARE  RARE   RBC / HPF 0-2  <3 RBC/hpf   Bacteria, UA RARE  RARE   Crystals URIC ACID CRYSTALS (*) NEGATIVE   Urine-Other MUCOUS PRESENT      Ct Head Wo Contrast  02/24/2014   CLINICAL DATA:  33 year old female with for seizures today and fever. Initial encounter.  EXAM: CT HEAD WITHOUT CONTRAST  TECHNIQUE: Contiguous axial images were obtained from the base of the skull through the vertex without intravenous contrast.  COMPARISON:  None.  FINDINGS: A trace bubbly opacity in the right sphenoid sinus. Other Visualized paranasal sinuses and mastoids are clear. No osseous abnormality identified. Dysconjugate gaze, otherwise negative orbits soft tissues. Visualized scalp soft tissues are within normal limits.  Cerebral volume is normal. No midline shift, ventriculomegaly, mass effect, evidence of mass lesion, intracranial hemorrhage or evidence of cortically based acute infarction. Gray-white matter differentiation is within normal limits throughout the brain. No suspicious intracranial vascular hyperdensity.  IMPRESSION: Normal noncontrast CT appearance of the brain.   Electronically Signed   By: Lars Pinks M.D.   On: 02/24/2014 21:17    Assessment/Plan: 33 year old lady with history of seizure disorder and compliance issues with recommended treatment, presenting with recurrent multiple generalized seizures as well as febrile state. Significance of potential infectious process is unclear.  Recommendations: 1. Agree  with plans for lumbar puncture to rule out acute meningitis as a contributing factor to recurrent seizures. 2. Dilantin 100 mg every 8 hours by mouth or IV. 3. Dilantin level in the a.m. 4. Patient will need followup appointment with outpatient neurologist following discharge for continuous seizure management.  5. IV acyclovir if CSF WBC count is increased, along with broad antibiotic coverage.  We will continue to follow this patient with you.  C.R. Nicole Kindred, MD [facility] [phone]  02/24/2014, 9:48 PM

## 2014-02-24 NOTE — ED Notes (Signed)
CBG 116  

## 2014-02-24 NOTE — ED Notes (Signed)
CT called and informed pt needs to have her CT scan stat per EDP.

## 2014-02-25 DIAGNOSIS — R569 Unspecified convulsions: Secondary | ICD-10-CM

## 2014-02-25 DIAGNOSIS — R509 Fever, unspecified: Secondary | ICD-10-CM

## 2014-02-25 LAB — CBC
HCT: 39.9 % (ref 36.0–46.0)
Hemoglobin: 13.2 g/dL (ref 12.0–15.0)
MCH: 27.4 pg (ref 26.0–34.0)
MCHC: 33.1 g/dL (ref 30.0–36.0)
MCV: 83 fL (ref 78.0–100.0)
PLATELETS: 323 10*3/uL (ref 150–400)
RBC: 4.81 MIL/uL (ref 3.87–5.11)
RDW: 14.4 % (ref 11.5–15.5)
WBC: 14.3 10*3/uL — ABNORMAL HIGH (ref 4.0–10.5)

## 2014-02-25 LAB — BASIC METABOLIC PANEL
Anion gap: 13 (ref 5–15)
BUN: 7 mg/dL (ref 6–23)
CALCIUM: 8.6 mg/dL (ref 8.4–10.5)
CO2: 22 mEq/L (ref 19–32)
Chloride: 103 mEq/L (ref 96–112)
Creatinine, Ser: 0.77 mg/dL (ref 0.50–1.10)
GFR calc Af Amer: >90 mL/min (ref >90)
Glucose, Bld: 129 mg/dL — ABNORMAL HIGH (ref 70–99)
Potassium: 3.7 mEq/L (ref 3.7–5.3)
Sodium: 138 mEq/L (ref 137–147)

## 2014-02-25 LAB — PROTEIN AND GLUCOSE, CSF
Glucose, CSF: 85 mg/dL — ABNORMAL HIGH (ref 43–76)
TOTAL PROTEIN, CSF: 13 mg/dL — AB (ref 15–45)

## 2014-02-25 LAB — MRSA PCR SCREENING: MRSA by PCR: NEGATIVE

## 2014-02-25 LAB — HERPES SIMPLEX VIRUS(HSV) DNA BY PCR
HSV 1 DNA: NOT DETECTED
HSV 2 DNA: NOT DETECTED

## 2014-02-25 LAB — GLUCOSE, CAPILLARY: Glucose-Capillary: 146 mg/dL — ABNORMAL HIGH (ref 70–99)

## 2014-02-25 LAB — PHENYTOIN LEVEL, TOTAL: Phenytoin Lvl: 8 ug/mL — ABNORMAL LOW (ref 10.0–20.0)

## 2014-02-25 LAB — CSF CELL COUNT WITH DIFFERENTIAL
Eosinophils, CSF: NONE SEEN % (ref 0–1)
MONOCYTE-MACROPHAGE-SPINAL FLUID: NONE SEEN % (ref 15–45)
RBC Count, CSF: 700 /mm3 — ABNORMAL HIGH
Tube #: 1
WBC, CSF: 2 /mm3 (ref 0–5)

## 2014-02-25 LAB — GRAM STAIN: Gram Stain: NONE SEEN

## 2014-02-25 LAB — CRYPTOCOCCAL ANTIGEN, CSF: Crypto Ag: NEGATIVE

## 2014-02-25 MED ORDER — ONDANSETRON HCL 4 MG/2ML IJ SOLN
4.0000 mg | Freq: Four times a day (QID) | INTRAMUSCULAR | Status: DC | PRN
  Filled 2014-02-25: qty 2

## 2014-02-25 MED ORDER — ONDANSETRON HCL 4 MG/2ML IJ SOLN: 4.0000 mg | Freq: Three times a day (TID) | INTRAMUSCULAR | Status: DC | PRN

## 2014-02-25 MED ORDER — PHENYTOIN SODIUM 50 MG/ML IJ SOLN
100.0000 mg | Freq: Three times a day (TID) | INTRAMUSCULAR | Status: DC
  Administered 2014-02-25: 100 mg via INTRAVENOUS
  Filled 2014-02-25 (×5): qty 2

## 2014-02-25 MED ORDER — DEXTROSE 5 % IV SOLN
500.0000 mg | Freq: Three times a day (TID) | INTRAVENOUS | Status: DC
  Administered 2014-02-25 – 2014-02-26 (×4): 500 mg via INTRAVENOUS
  Filled 2014-02-25 (×7): qty 10

## 2014-02-25 MED ORDER — DEXTROSE-NACL 5-0.45 % IV SOLN
INTRAVENOUS | Status: DC
  Administered 2014-02-25: 01:00:00 via INTRAVENOUS

## 2014-02-25 MED ORDER — VANCOMYCIN HCL IN DEXTROSE 1-5 GM/200ML-% IV SOLN
1000.0000 mg | Freq: Three times a day (TID) | INTRAVENOUS | Status: DC
  Administered 2014-02-25: 1000 mg via INTRAVENOUS
  Filled 2014-02-25 (×2): qty 200

## 2014-02-25 MED ORDER — DEXTROSE 5 % IV SOLN
2.0000 g | Freq: Two times a day (BID) | INTRAVENOUS | Status: DC
  Administered 2014-02-25: 2 g via INTRAVENOUS
  Filled 2014-02-25 (×2): qty 2

## 2014-02-25 MED ORDER — PHENYTOIN SODIUM EXTENDED 100 MG PO CAPS
100.0000 mg | ORAL_CAPSULE | Freq: Three times a day (TID) | ORAL | Status: DC
  Administered 2014-02-25 – 2014-02-27 (×6): 100 mg via ORAL
  Filled 2014-02-25 (×9): qty 1

## 2014-02-25 MED ORDER — LORAZEPAM 2 MG/ML IJ SOLN: 1.0000 mg | INTRAMUSCULAR | Status: DC | PRN

## 2014-02-25 MED ORDER — ENOXAPARIN SODIUM 40 MG/0.4ML ~~LOC~~ SOLN
40.0000 mg | SUBCUTANEOUS | Status: DC
  Administered 2014-02-25 – 2014-02-27 (×3): 40 mg via SUBCUTANEOUS
  Filled 2014-02-25 (×3): qty 0.4

## 2014-02-25 MED ORDER — ONDANSETRON HCL 4 MG PO TABS
4.0000 mg | ORAL_TABLET | Freq: Four times a day (QID) | ORAL | Status: DC | PRN
  Administered 2014-02-26: 4 mg via ORAL
  Filled 2014-02-25: qty 1

## 2014-02-25 MED ORDER — SODIUM CHLORIDE 0.9 % IV SOLN
INTRAVENOUS | Status: DC
  Administered 2014-02-25: 01:00:00 via INTRAVENOUS

## 2014-02-25 MED ORDER — SODIUM CHLORIDE 0.9 % IJ SOLN
3.0000 mL | Freq: Two times a day (BID) | INTRAMUSCULAR | Status: DC
  Administered 2014-02-27: 3 mL via INTRAVENOUS

## 2014-02-25 NOTE — Procedures (Signed)
Procedure: LP with Fluoro guidance. Specimen: CSF to lab Bleeding: minimal. Complications: None immediate. Patient   -Condition: Stable.  -Disposition:  To ED.  Full Radiology Report to Follow

## 2014-02-25 NOTE — Progress Notes (Signed)
Pt arrived from ED, pt laying flat after lumbar puncture. Pt is A/O x 4. IV access in Right AC. Temp is 100.4. Family in waiting room visiting and updated.

## 2014-02-25 NOTE — ED Notes (Signed)
Pt still in radiology.

## 2014-02-25 NOTE — Progress Notes (Signed)
[facility] Progress Note   Anne Benjamin FAO:[id number] DOB: 17-Sep-1980 DOA: 02/24/2014 PCP: No PCP Per Patient  Brief narrative: Anne Benjamin is a 33 y.o. female admitted for fever and seizures   Subjective: Sleepy. No other complaints. No neck stiffness or headache.   Assessment/Plan: Principal Problem:   Seizures - Dilantin added- will change to PO - check level again in AM- follow for further seizures - pt unable to tolerate increase in Keppra due to nausea  Active Problems:   Fever/ leukocytosis - LP reveals RBC but otherwise results are not consistent with bacterial or viral etiology- d/c antibiotics - will cont Acylovir for now for possible HSV encephalitis but my suspicion is low- will see if neuro would recommend d/cing it.     Code Status: full code Family Communication: with husband and mother in low Disposition Plan: home DVT prophylaxis: Lovenox  Consultants: Neuro  Procedures: LP  Antibiotics: Anti-infectives   Start     Dose/Rate Route Frequency Ordered Stop   02/25/14 0800  acyclovir (ZOVIRAX) 500 mg in dextrose 5 % 100 mL IVPB     500 mg 110 mL/hr over 60 Minutes Intravenous Every 8 hours 02/25/14 0111     02/25/14 0600  vancomycin (VANCOCIN) IVPB 1000 mg/200 mL premix  Status:  Discontinued     1,000 mg 200 mL/hr over 60 Minutes Intravenous Every 8 hours 02/25/14 0111 02/25/14 0714   02/25/14 0115  cefTRIAXone (ROCEPHIN) 2 g in dextrose 5 % 50 mL IVPB  Status:  Discontinued     2 g 100 mL/hr over 30 Minutes Intravenous Every 12 hours 02/25/14 0111 02/25/14 0714   02/24/14 2230  vancomycin (VANCOCIN) IVPB 1000 mg/200 mL premix     1,000 mg 200 mL/hr over 60 Minutes Intravenous  Once 02/24/14 2226 02/24/14 2337   02/24/14 2230  acyclovir (ZOVIRAX) 455 mg in dextrose 5 % 100 mL IVPB     10 mg/kg  45.5 kg (Ideal) 109.1 mL/hr over 60 Minutes Intravenous  Once 02/24/14 2226 02/25/14 0210   02/24/14 2230  piperacillin-tazobactam (ZOSYN) IVPB 3.375  g     3.375 g 100 mL/hr over 30 Minutes Intravenous  Once 02/24/14 2226 02/25/14 0016         Objective: Filed Weights   02/24/14 1709 02/25/14 0100  Weight: 81.647 kg (180 lb) 94.8 kg (208 lb 15.9 oz)    Intake/Output Summary (Last 24 hours) at 02/25/14 1300 Last data filed at 02/25/14 1000  Gross per 24 hour  Intake 2136.67 ml  Output    450 ml  Net 1686.67 ml     Vitals Filed Vitals:   02/25/14 0016 02/25/14 0100 02/25/14 0415 02/25/14 0840  BP: 104/72  90/52 120/68  Pulse: 111     Temp: 99.4 F (37.4 C) 100.4 F (38 C) 99.8 F (37.7 C) 98.8 F (37.1 C)  TempSrc: Oral Oral Oral Oral  Resp: 20     Height:  4\' 11"  (1.499 m)    Weight:  94.8 kg (208 lb 15.9 oz)    SpO2: 96%       Exam: General: No acute respiratory distress Lungs: Clear to auscultation bilaterally without wheezes or crackles Cardiovascular: Regular rate and rhythm without murmur gallop or rub normal S1 and S2 Abdomen: Nontender, nondistended, soft, bowel sounds positive, no rebound, no ascites, no appreciable mass Extremities: No significant cyanosis, clubbing, or edema bilateral lower extremities  Data Reviewed: Basic Metabolic Panel:  Recent Labs Lab 02/24/14 1735 02/25/14 0322  NA  137 138  K 4.1 3.7  CL 100 103  CO2 22 22  GLUCOSE 114* 129*  BUN 8 7  CREATININE 0.74 0.77  CALCIUM 9.2 8.6   Liver Function Tests: No results found for this basename: AST, ALT, ALKPHOS, BILITOT, PROT, ALBUMIN,  in the last 168 hours No results found for this basename: LIPASE, AMYLASE,  in the last 168 hours No results found for this basename: AMMONIA,  in the last 168 hours CBC:  Recent Labs Lab 02/24/14 1735 02/25/14 0322  WBC 17.2* 14.3*  HGB 15.2* 13.2  HCT 45.0 39.9  MCV 82.4 83.0  PLT 341 323   Cardiac Enzymes: No results found for this basename: CKTOTAL, CKMB, CKMBINDEX, TROPONINI,  in the last 168 hours BNP (last 3 results) No results found for this basename: PROBNP,  in the last  8760 hours CBG:  Recent Labs Lab 02/24/14 1731 02/25/14 0048  GLUCAP 116* 146*    Recent Results (from the past 240 hour(s))  GRAM STAIN     Status: None   Collection Time    02/25/14 12:33 AM      Result Value Ref Range Status   Specimen Description CSF   Final   Special Requests NONE   Final   Gram Stain     Final   Value: NO WBC SEEN     NO ORGANISMS SEEN     CYTOSPUN   Report Status 02/25/2014 FINAL   Final  CSF CULTURE     Status: None   Collection Time    02/25/14 12:34 AM      Result Value Ref Range Status   Specimen Description CSF   Final   Special Requests NONE   Final   Gram Stain     Final   Value: CYTOSPIN SLIDE NO WBC SEEN     NO ORGANISMS SEEN     Performed at [facility]     Performed at [facility]   Culture PENDING   Incomplete   Report Status PENDING   Incomplete  MRSA PCR SCREENING     Status: None   Collection Time    02/25/14  1:30 AM      Result Value Ref Range Status   MRSA by PCR NEGATIVE  NEGATIVE Final   Comment:            The GeneXpert MRSA Assay (FDA     approved for NASAL specimens     only), is one component of a     comprehensive MRSA colonization     surveillance program. It is not     intended to diagnose MRSA     infection nor to guide or     monitor treatment for     MRSA infections.     Studies:  Recent x-ray studies have been reviewed in detail by the Attending Physician  Scheduled Meds:  Scheduled Meds: . acyclovir  500 mg Intravenous Q8H  . enoxaparin (LOVENOX) injection  40 mg Subcutaneous Q24H  . phenytoin  100 mg Oral TID  . sodium chloride  3 mL Intravenous Q12H   Continuous Infusions:   Time spent on care of this patient: 35 min   [place], MD 02/25/2014, 1:00 PM  LOS: 1 day   [facility] Office  [phone] Pager - Text Page per [email or web address]  If 7PM-7AM, please contact night-coverage [email or web address]

## 2014-02-25 NOTE — Progress Notes (Signed)
ANTIBIOTIC CONSULT NOTE - INITIAL  Pharmacy Consult for vancomycin, ceftriaxone, and acyclovir Indication: r/o meningitis  Allergies  Allergen Reactions  . Orange Oil     unknown  . Penicillins     Pt states she experiences nausea and vomiting    Patient Measurements: Height: 5' (152.4 cm) Weight: 180 lb (81.647 kg) IBW/kg (Calculated) : 45.5  Vital Signs: Temp: 99.4 F (37.4 C) (10/23 0016) Temp Source: Oral (10/23 0016) BP: 104/72 mmHg (10/23 0016) Pulse Rate: 111 (10/23 0016)  Labs:  Recent Labs  02/24/14 1735  WBC 17.2*  HGB 15.2*  PLT 341  CREATININE 0.74   Estimated Creatinine Clearance: 92.8 ml/min (by C-G formula based on Cr of 0.74).   Microbiology: No results found for this or any previous visit (from the past 720 hour(s)).  Medical History: Past Medical History  Diagnosis Date  . Seizures   . Convulsions/seizures 09/03/2012  . Obesity     Medications:  Prescriptions prior to admission  Medication Sig Dispense Refill  . levETIRAcetam (KEPPRA) 500 MG tablet Take 1 tablet (500 mg total) by mouth 2 (two) times daily.  180 tablet  1  . Multiple Vitamin (MULTIVITAMIN) tablet Take 1 tablet by mouth daily.       Scheduled:  . acyclovir  10 mg/kg (Ideal) Intravenous Once  . enoxaparin (LOVENOX) injection  40 mg Subcutaneous Q24H  . phenytoin (DILANTIN) IV  100 mg Intravenous 3 times per day  . sodium chloride  3 mL Intravenous Q12H   Infusions:  . sodium chloride 100 mL/hr at 02/25/14 0100  . dextrose 5 % and 0.45% NaCl      Assessment: 33yo female w/ h/o sz d/o had multiple witnessed generalized szs, has been taking only one-quarter of prescribed Keppra 2/2 adverse effects (ie, nausea), last sz before this was 2y ago, found to have leukocytosis and fever, notes mild pressure in head but no frank HA, concern for meningitis, to begin anti-infectives.  Goal of Therapy:  Vancomycin trough level 15-20 mcg/ml  Plan:  Rec'd vanc 1g, Zosyn 3.375g IV in  ED, acyclovir was ordered but not given yet; will continue with vancomycin 1000mg  IV Q8H, Rocephin 2g IV Q12H, and acyclovir 500mg  IV Q8H and monitor CBC, Cx, levels prn.  Wynona Neat, PharmD, BCPS  02/25/2014,1:04 AM

## 2014-02-26 DIAGNOSIS — R509 Fever, unspecified: Secondary | ICD-10-CM

## 2014-02-26 DIAGNOSIS — R569 Unspecified convulsions: Secondary | ICD-10-CM

## 2014-02-26 LAB — PHENYTOIN LEVEL, TOTAL: Phenytoin Lvl: 8.2 ug/mL — ABNORMAL LOW (ref 10.0–20.0)

## 2014-02-26 LAB — ALBUMIN: ALBUMIN: 3.1 g/dL — AB (ref 3.5–5.2)

## 2014-02-26 MED ORDER — ACETAMINOPHEN 325 MG PO TABS
650.0000 mg | ORAL_TABLET | Freq: Four times a day (QID) | ORAL | Status: DC | PRN
Start: 2014-02-26 — End: 2014-02-27
  Administered 2014-02-26: 650 mg via ORAL
  Filled 2014-02-26: qty 2

## 2014-02-26 NOTE — Progress Notes (Addendum)
NEURO HOSPITALIST PROGRESS NOTE   SUBJECTIVE:                                                                                                                        No further seizures reported. Complains of frontal head pressure but has no other neurological complains. Feels that she is mentally back to her baseline. Tolerating dilantin well. Dilantin level today 8.2   OBJECTIVE:                                                                                                                           Vital signs in last 24 hours: Temp:  [97.2 F (36.2 C)-99.4 F (37.4 C)] 98.2 F (36.8 C) (10/24 0802) Pulse Rate:  [86-117] 90 (10/24 0802) Resp:  [15-22] 16 (10/24 0802) BP: (119-145)/(51-96) 122/51 mmHg (10/24 0802) SpO2:  [96 %-99 %] 96 % (10/24 0802)  Intake/Output from previous day: 10/23 0701 - 10/24 0700 In: 2306.7 [P.O.:200; I.V.:1786.7; IV Piggyback:320] Out: 750 [Urine:750] Intake/Output this shift: Total I/O In: 110 [IV Piggyback:110] Out: 1000 [Urine:1000] Nutritional status: General  Past Medical History  Diagnosis Date  . Seizures   . Convulsions/seizures 09/03/2012  . Obesity     Physical exam: pleasant female in no apparent distress. Head: normocephalic. Neck: supple, no bruits, no JVD. Cardiac: no murmurs. Lungs: clear. Abdomen: soft, no tender, no mass. Extremities: no edema.  Neurologic Exam:  General: Mental Status: Alert, oriented, thought content appropriate.  Speech fluent without evidence of aphasia.  Able to follow 3 step commands without difficulty. Cranial Nerves: II: Discs flat bilaterally; Visual fields grossly normal, pupils equal, round, reactive to light and accommodation III,IV, VI: ptosis not present, extra-ocular motions intact bilaterally V,VII: smile symmetric, facial light touch sensation normal bilaterally VIII: hearing normal bilaterally IX,X: gag reflex present XI: bilateral shoulder  shrug XII: midline tongue extension without atrophy or fasciculations Motor: Right : Upper extremity   5/5    Left:     Upper extremity   5/5  Lower extremity   5/5     Lower extremity   5/5 Tone and bulk:normal tone throughout; no atrophy noted Sensory: Pinprick and light touch intact throughout, bilaterally Deep Tendon Reflexes:  Right: Upper Extremity  Left: Upper extremity   biceps (C-5 to C-6) 2/4   biceps (C-5 to C-6) 2/4 tricep (C7) 2/4    triceps (C7) 2/4 Brachioradialis (C6) 2/4  Brachioradialis (C6) 2/4  Lower Extremity Lower Extremity  quadriceps (L-2 to L-4) 2/4   quadriceps (L-2 to L-4) 2/4 Achilles (S1) 2/4   Achilles (S1) 2/4  Plantars: Right: downgoing   Left: downgoing Cerebellar: normal finger-to-nose,  normal heel-to-shin test Gait:  No tested CV: pulses palpable throughout    Lab Results: No results found for this basename: cbc, bmp, coags, chol, tri, ldl, hga1c   Lipid Panel No results found for this basename: CHOL, TRIG, HDL, CHOLHDL, VLDL, LDLCALC,  in the last 72 hours  Studies/Results: Ct Head Wo Contrast  02/24/2014   CLINICAL DATA:  33 year old female with for seizures today and fever. Initial encounter.  EXAM: CT HEAD WITHOUT CONTRAST  TECHNIQUE: Contiguous axial images were obtained from the base of the skull through the vertex without intravenous contrast.  COMPARISON:  None.  FINDINGS: A trace bubbly opacity in the right sphenoid sinus. Other Visualized paranasal sinuses and mastoids are clear. No osseous abnormality identified. Dysconjugate gaze, otherwise negative orbits soft tissues. Visualized scalp soft tissues are within normal limits.  Cerebral volume is normal. No midline shift, ventriculomegaly, mass effect, evidence of mass lesion, intracranial hemorrhage or evidence of cortically based acute infarction. Gray-white matter differentiation is within normal limits throughout the brain. No suspicious intracranial vascular hyperdensity.   IMPRESSION: Normal noncontrast CT appearance of the brain.   Electronically Signed   By: Lars Pinks M.D.   On: 02/24/2014 21:17   Dg Fluoro Guide Lumbar Puncture  02/25/2014   CLINICAL DATA:  33 year old female with altered mental status and fever of unknown origin. Initial encounter.  EXAM: DIAGNOSTIC LUMBAR PUNCTURE UNDER FLUOROSCOPIC GUIDANCE  FLUOROSCOPY TIME:  0 min and 9 seconds.  PROCEDURE: Informed consent was obtained from the patient prior to the procedure, including potential complications of headache, allergy, and pain. A "time-out" was performed.  With the patient prone, the lower back was prepped with Betadine. 1% Lidocaine was used for local anesthesia. Lumbar puncture was performed at the L2-L3 level using a 5 inch x 22 gauge needle with return of initially blood tinged, ultimately clear CSF with an opening pressure of 17 cm water. 13 mL of CSF were obtained for laboratory studies. The patient tolerated the procedure well and there were no apparent complications. Appropriate postprocedural orders were placed in the chart in the patient was returned to the emergency department in stable condition.  IMPRESSION: Lumbar puncture with fluoroscopy guidance at L2-L3. CSF sent to the lab for analysis.   Electronically Signed   By: Lars Pinks M.D.   On: 02/25/2014 00:38    MEDICATIONS                                                                                                                        Scheduled: . acyclovir  500 mg  Intravenous Q8H  . enoxaparin (LOVENOX) injection  40 mg Subcutaneous Q24H  . phenytoin  100 mg Oral TID  . 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 Doing well at this moment. Dilantin level still sub therapeutic and suspect she will need a higher maintenance dose for her weight (5 mg x kg). Will get albumin level and dose dilantin accordingly.  Dorian Pod, MD [facility] [phone]  02/26/2014, 9:31 AM

## 2014-02-26 NOTE — Progress Notes (Signed)
[facility] Progress Note   Anne Benjamin ZDG:[id number] DOB: Mar 22, 1981 DOA: 02/24/2014 PCP: No PCP Per Patient  Brief narrative: Anne Benjamin is a 33 y.o. female admitted for fever and seizures   Subjective: No longer feeling sleepy. No complaints.   Assessment/Plan: Principal Problem:   Seizures - Dilantin added- will change to PO - check level again in AM- follow for further seizures - pt unable to tolerate increase in Keppra due to nausea  Active Problems:   Fever/ leukocytosis - LP reveals RBC but otherwise results are not consistent with bacterial or viral etiology- d/c antibiotics -  HSV PCR negative- will d/c Acyclovir     Code Status: full code Family Communication: with husband and mother in law Disposition Plan: home DVT prophylaxis: Lovenox  Consultants: Neuro  Procedures: LP  Antibiotics: Anti-infectives   Start     Dose/Rate Route Frequency Ordered Stop   02/25/14 0800  acyclovir (ZOVIRAX) 500 mg in dextrose 5 % 100 mL IVPB  Status:  Discontinued     500 mg 110 mL/hr over 60 Minutes Intravenous Every 8 hours 02/25/14 0111 02/26/14 1508   02/25/14 0600  vancomycin (VANCOCIN) IVPB 1000 mg/200 mL premix  Status:  Discontinued     1,000 mg 200 mL/hr over 60 Minutes Intravenous Every 8 hours 02/25/14 0111 02/25/14 0714   02/25/14 0115  cefTRIAXone (ROCEPHIN) 2 g in dextrose 5 % 50 mL IVPB  Status:  Discontinued     2 g 100 mL/hr over 30 Minutes Intravenous Every 12 hours 02/25/14 0111 02/25/14 0714   02/24/14 2230  vancomycin (VANCOCIN) IVPB 1000 mg/200 mL premix     1,000 mg 200 mL/hr over 60 Minutes Intravenous  Once 02/24/14 2226 02/24/14 2337   02/24/14 2230  acyclovir (ZOVIRAX) 455 mg in dextrose 5 % 100 mL IVPB     10 mg/kg  45.5 kg (Ideal) 109.1 mL/hr over 60 Minutes Intravenous  Once 02/24/14 2226 02/25/14 0210   02/24/14 2230  piperacillin-tazobactam (ZOSYN) IVPB 3.375 g     3.375 g 100 mL/hr over 30 Minutes Intravenous  Once 02/24/14 2226  02/25/14 0016         Objective: Filed Weights   02/24/14 1709 02/25/14 0100  Weight: 81.647 kg (180 lb) 94.8 kg (208 lb 15.9 oz)    Intake/Output Summary (Last 24 hours) at 02/26/14 1508 Last data filed at 02/26/14 0838  Gross per 24 hour  Intake    330 ml  Output   1000 ml  Net   -670 ml     Vitals Filed Vitals:   02/25/14 2332 02/26/14 0417 02/26/14 0802 02/26/14 1131  BP: 121/74 119/96 122/51 134/91  Pulse: 117 86 90 86  Temp: 99 F (37.2 C) 98.9 F (37.2 C) 98.2 F (36.8 C) 98.5 F (36.9 C)  TempSrc: Oral Axillary Oral Oral  Resp: 21 16 16 13   Height:      Weight:      SpO2: 99% 99% 96% 96%    Exam: General: No acute respiratory distress Lungs: Clear to auscultation bilaterally without wheezes or crackles Cardiovascular: Regular rate and rhythm without murmur gallop or rub normal S1 and S2 Abdomen: Nontender, nondistended, soft, bowel sounds positive, no rebound, no ascites, no appreciable mass Extremities: No significant cyanosis, clubbing, or edema bilateral lower extremities  Data Reviewed: Basic Metabolic Panel:  Recent Labs Lab 02/24/14 1735 02/25/14 0322  NA 137 138  K 4.1 3.7  CL 100 103  CO2 22 22  GLUCOSE 114* 129*  BUN 8 7  CREATININE 0.74 0.77  CALCIUM 9.2 8.6   Liver Function Tests:  Recent Labs Lab 02/26/14 1215  ALBUMIN 3.1*   No results found for this basename: LIPASE, AMYLASE,  in the last 168 hours No results found for this basename: AMMONIA,  in the last 168 hours CBC:  Recent Labs Lab 02/24/14 1735 02/25/14 0322  WBC 17.2* 14.3*  HGB 15.2* 13.2  HCT 45.0 39.9  MCV 82.4 83.0  PLT 341 323   Cardiac Enzymes: No results found for this basename: CKTOTAL, CKMB, CKMBINDEX, TROPONINI,  in the last 168 hours BNP (last 3 results) No results found for this basename: PROBNP,  in the last 8760 hours CBG:  Recent Labs Lab 02/24/14 1731 02/25/14 0048  GLUCAP 116* 146*    Recent Results (from the past 240 hour(s))   GRAM STAIN     Status: None   Collection Time    02/25/14 12:33 AM      Result Value Ref Range Status   Specimen Description CSF   Final   Special Requests NONE   Final   Gram Stain     Final   Value: NO WBC SEEN     NO ORGANISMS SEEN     CYTOSPUN   Report Status 02/25/2014 FINAL   Final  CSF CULTURE     Status: None   Collection Time    02/25/14 12:34 AM      Result Value Ref Range Status   Specimen Description CSF   Final   Special Requests NONE   Final   Gram Stain     Final   Value: CYTOSPIN SLIDE NO WBC SEEN     NO ORGANISMS SEEN     Performed at [facility]     Performed at [facility]   Culture PENDING   Incomplete   Report Status PENDING   Incomplete  MRSA PCR SCREENING     Status: None   Collection Time    02/25/14  1:30 AM      Result Value Ref Range Status   MRSA by PCR NEGATIVE  NEGATIVE Final   Comment:            The GeneXpert MRSA Assay (FDA     approved for NASAL specimens     only), is one component of a     comprehensive MRSA colonization     surveillance program. It is not     intended to diagnose MRSA     infection nor to guide or     monitor treatment for     MRSA infections.     Studies:  Recent x-ray studies have been reviewed in detail by the Attending Physician  Scheduled Meds:  Scheduled Meds: . enoxaparin (LOVENOX) injection  40 mg Subcutaneous Q24H  . phenytoin  100 mg Oral TID  . sodium chloride  3 mL Intravenous Q12H   Continuous Infusions:   Time spent on care of this patient: 35 min   [place], MD 02/26/2014, 3:08 PM  LOS: 2 days   [facility] Office  [phone] Pager - Text Page per [email or web address]  If 7PM-7AM, please contact night-coverage [email or web address]

## 2014-02-27 LAB — PHENYTOIN LEVEL, TOTAL: Phenytoin Lvl: 7.6 ug/mL — ABNORMAL LOW (ref 10.0–20.0)

## 2014-02-27 LAB — VDRL, CSF: VDRL Quant, CSF: NONREACTIVE

## 2014-02-27 MED ORDER — PHENYTOIN SODIUM EXTENDED 100 MG PO CAPS: 100.0000 mg | ORAL_CAPSULE | Freq: Two times a day (BID) | ORAL | Status: DC

## 2014-02-27 MED ORDER — PHENYTOIN SODIUM EXTENDED 30 MG PO CAPS
150.0000 mg | ORAL_CAPSULE | Freq: Every day | ORAL | Status: DC
  Administered 2014-02-27: 150 mg via ORAL
  Filled 2014-02-27: qty 5

## 2014-02-27 MED ORDER — PHENYTOIN SODIUM EXTENDED 30 MG PO CAPS
150.0000 mg | ORAL_CAPSULE | Freq: Three times a day (TID) | ORAL | Status: DC
Start: 2014-02-27 — End: 2014-02-27

## 2014-02-27 MED ORDER — PHENYTOIN SODIUM EXTENDED 30 MG PO CAPS: 150.0000 mg | ORAL_CAPSULE | Freq: Every day | ORAL | Status: DC

## 2014-02-27 NOTE — Discharge Summary (Addendum)
Physician Discharge Summary  Sue Fernicola PXT:[id number] DOB: [birth year] DOA: 02/24/2014  PCP: No PCP Per Patient  Admit date: 02/24/2014 Discharge date: 02/27/2014  Time spent: >45 minutes  Recommendations for Outpatient Follow-up:  1. Dilantin and albumin levels in 3-5 days  Discharge Condition: stable Diet recommendation: heart healthy  Discharge Diagnoses:  Principal Problem:   Seizure Active Problems:   Fever   History of present illness:  Anne Benjamin is a 33 y.o. female, post medical history significant for seizure disorder on Keppra, presenting with 4 episodes of witnessed seizures that started on afternoon of 10/22. The patient was diagnosed to have a seizure disorder few years ago and was started on Keppra 500 mg twice a day however she cut the dose in 1/4 and was taken 250 mg by mouth daily due to intolerance. An LP was ordered by radiology . Patient was started on IV antibiotics and anti-viral medications and was placed in isolation . Neurology was consulted   Hospital Course:  Principal Problem:  Seizures  - Dilantin added- dose adjusted to 100mg  BID and 150 mg at lunch - level when calculate with albumin is about 10.5 on 100 TID- - - Dr Aram Beecham recommended a f/u level in a few days - pt initially stated she was unable to tolerate 500 BID of Keppra due to nausea -   Active Problems:  Fever/ leukocytosis  - LP reveals RBC but otherwise results are not consistent with bacterial or viral etiology- d/c antibiotics  - HSV PCR negative- d/c' Acyclovir - source of fevers uncertain- may have been related to continuous seizures- possible aspiration- no recurrence has been noted - no symptoms of pneumonia    Procedures:  LP  Consultations:  Neuro  Discharge Exam: Filed Weights   02/24/14 1709 02/25/14 0100 02/26/14 1721  Weight: 81.647 kg (180 lb) 94.8 kg (208 lb 15.9 oz) 94.348 kg (208 lb)   Filed Vitals:   02/27/14 0556  BP: 140/88  Pulse: 90  Temp: 97.9 F  (36.6 C)  Resp: 17    General: AAO x 3, no distress Cardiovascular: RRR, no murmurs  Respiratory: clear to auscultation bilaterally GI: soft, non-tender, non-distended, bowel sound positive  Discharge Instructions You were cared for by a hospitalist during your hospital stay. If you have any questions about your discharge medications or the care you received while you were in the hospital after you are discharged, you can call the unit and asked to speak with the hospitalist on call if the hospitalist that took care of you is not available. Once you are discharged, your primary care physician will handle any further medical issues. Please note that NO REFILLS for any discharge medications will be authorized once you are discharged, as it is imperative that you return to your primary care physician (or establish a relationship with a primary care physician if you do not have one) for your aftercare needs so that they can reassess your need for medications and monitor your lab values.  Discharge Instructions   Diet - low sodium heart healthy    Complete by:  As directed      Increase activity slowly    Complete by:  As directed             Medication List    STOP taking these medications       levETIRAcetam 500 MG tablet  Commonly known as:  KEPPRA      TAKE these medications       multivitamin  tablet  Take 1 tablet by mouth daily.     phenytoin 100 MG ER capsule  Commonly known as:  DILANTIN  Take 1 capsule (100 mg total) by mouth 2 (two) times daily.     phenytoin 30 MG ER capsule  Commonly known as:  DILANTIN  Take 5 capsules (150 mg total) by mouth daily at 2 PM daily at 2 PM.       Allergies  Allergen Reactions  . Orange Oil     unknown  . Penicillins     Pt states she experiences nausea and vomiting      The results of significant diagnostics from this hospitalization (including imaging, microbiology, ancillary and laboratory) are listed below for reference.     Significant Diagnostic Studies: Ct Head Wo Contrast  02/24/2014   CLINICAL DATA:  33 year old female with for seizures today and fever. Initial encounter.  EXAM: CT HEAD WITHOUT CONTRAST  TECHNIQUE: Contiguous axial images were obtained from the base of the skull through the vertex without intravenous contrast.  COMPARISON:  None.  FINDINGS: A trace bubbly opacity in the right sphenoid sinus. Other Visualized paranasal sinuses and mastoids are clear. No osseous abnormality identified. Dysconjugate gaze, otherwise negative orbits soft tissues. Visualized scalp soft tissues are within normal limits.  Cerebral volume is normal. No midline shift, ventriculomegaly, mass effect, evidence of mass lesion, intracranial hemorrhage or evidence of cortically based acute infarction. Gray-white matter differentiation is within normal limits throughout the brain. No suspicious intracranial vascular hyperdensity.  IMPRESSION: Normal noncontrast CT appearance of the brain.   Electronically Signed   By: Lars Pinks M.D.   On: 02/24/2014 21:17   Dg Fluoro Guide Lumbar Puncture  02/25/2014   CLINICAL DATA:  33 year old female with altered mental status and fever of unknown origin. Initial encounter.  EXAM: DIAGNOSTIC LUMBAR PUNCTURE UNDER FLUOROSCOPIC GUIDANCE  FLUOROSCOPY TIME:  0 min and 9 seconds.  PROCEDURE: Informed consent was obtained from the patient prior to the procedure, including potential complications of headache, allergy, and pain. A "time-out" was performed.  With the patient prone, the lower back was prepped with Betadine. 1% Lidocaine was used for local anesthesia. Lumbar puncture was performed at the L2-L3 level using a 5 inch x 22 gauge needle with return of initially blood tinged, ultimately clear CSF with an opening pressure of 17 cm water. 13 mL of CSF were obtained for laboratory studies. The patient tolerated the procedure well and there were no apparent complications. Appropriate postprocedural  orders were placed in the chart in the patient was returned to the emergency department in stable condition.  IMPRESSION: Lumbar puncture with fluoroscopy guidance at L2-L3. CSF sent to the lab for analysis.   Electronically Signed   By: Lars Pinks M.D.   On: 02/25/2014 00:38    Microbiology: Recent Results (from the past 240 hour(s))  GRAM STAIN     Status: None   Collection Time    02/25/14 12:33 AM      Result Value Ref Range Status   Specimen Description CSF   Final   Special Requests NONE   Final   Gram Stain     Final   Value: NO WBC SEEN     NO ORGANISMS SEEN     CYTOSPUN   Report Status 02/25/2014 FINAL   Final  CSF CULTURE     Status: None   Collection Time    02/25/14 12:34 AM      Result Value Ref Range Status  Specimen Description CSF   Final   Special Requests NONE   Final   Gram Stain     Final   Value: CYTOSPIN SLIDE NO WBC SEEN     NO ORGANISMS SEEN     Performed at [facility]     Performed at [facility]   Culture     Final   Value: NO GROWTH 1 DAY     Performed at [facility]   Report Status PENDING   Incomplete  MRSA PCR SCREENING     Status: None   Collection Time    02/25/14  1:30 AM      Result Value Ref Range Status   MRSA by PCR NEGATIVE  NEGATIVE Final   Comment:            The GeneXpert MRSA Assay (FDA     approved for NASAL specimens     only), is one component of a     comprehensive MRSA colonization     surveillance program. It is not     intended to diagnose MRSA     infection nor to guide or     monitor treatment for     MRSA infections.     Labs: Basic Metabolic Panel:  Recent Labs Lab 02/24/14 1735 02/25/14 0322  NA 137 138  K 4.1 3.7  CL 100 103  CO2 22 22  GLUCOSE 114* 129*  BUN 8 7  CREATININE 0.74 0.77  CALCIUM 9.2 8.6   Liver Function Tests:  Recent Labs Lab 02/26/14 1215  ALBUMIN 3.1*   No results found for this basename: LIPASE, AMYLASE,  in the last 168 hours No results found  for this basename: AMMONIA,  in the last 168 hours CBC:  Recent Labs Lab 02/24/14 1735 02/25/14 0322  WBC 17.2* 14.3*  HGB 15.2* 13.2  HCT 45.0 39.9  MCV 82.4 83.0  PLT 341 323   Cardiac Enzymes: No results found for this basename: CKTOTAL, CKMB, CKMBINDEX, TROPONINI,  in the last 168 hours BNP: BNP (last 3 results) No results found for this basename: PROBNP,  in the last 8760 hours CBG:  Recent Labs Lab 02/24/14 1731 02/25/14 0048  GLUCAP 116* 146*       SignedDebbe Odea, MD [facility] 02/27/2014, 11:16 AM

## 2014-02-27 NOTE — Progress Notes (Signed)
Anne Benjamin to be D/C'd Home per MD order.  Discussed with the patient and all questions fully answered.  VSS, Skin clean, dry and intact without evidence of skin break down, no evidence of skin tears noted. IV catheter discontinued intact. Site without signs and symptoms of complications. Dressing and pressure applied.  An After Visit Summary was printed and given to the patient. Patient received prescription for Dilantin and instructed regarding the importance of taking it daily at the same time.  D/c education completed with patient/family including follow up instructions, medication list, d/c activities limitations if indicated, with other d/c instructions as indicated by MD - patient able to verbalize understanding, all questions fully answered.   Patient instructed to return to ED, call 911, or call MD for any changes in condition.   Patient escorted via [place], and D/C home via private auto.  [name] 02/27/2014 12:08 PM

## 2014-02-28 LAB — CSF CULTURE W GRAM STAIN
Culture: NO GROWTH
Gram Stain: NONE SEEN

## 2014-03-03 ENCOUNTER — Encounter: Payer: Self-pay | Admitting: Neurology

## 2014-03-03 ENCOUNTER — Ambulatory Visit (INDEPENDENT_AMBULATORY_CARE_PROVIDER_SITE_OTHER): Payer: 59 | Admitting: Neurology

## 2014-03-03 VITALS — BP 128/94 | HR 89 | Resp 16 | Ht 58.75 in | Wt 198.0 lb

## 2014-03-03 DIAGNOSIS — G40009 Localization-related (focal) (partial) idiopathic epilepsy and epileptic syndromes with seizures of localized onset, not intractable, without status epilepticus: Secondary | ICD-10-CM

## 2014-03-03 NOTE — Patient Instructions (Signed)
1. Continue Dilantin 100 mg in AM, 130mg  in afternoon, 100mg  at bedtime for now 2. Start Lamotrigine ER 25mg : Take 1 tablet daily for 2 weeks, then increase to 2 tablets daily for 2 weeks, then increase to 4 tablets daily 3. Schedule MRI brain with and without contrast 4. Call our office for any problems, follow-up in 6 weeks  Seizure Precautions: 1. If medication has been prescribed for you to prevent seizures, take it exactly as directed.  Do not stop taking the medicine without talking to your doctor first, even if you have not had a seizure in a long time.   2. Avoid activities in which a seizure would cause danger to yourself or to others.  Don't operate dangerous machinery, swim alone, or climb in high or dangerous places, such as on ladders, roofs, or girders.  Do not drive unless your doctor says you may.  3. If you have any warning that you may have a seizure, lay down in a safe place where you can't hurt yourself.    4.  No driving for 6 months from last seizure, as per [facility].   Please refer to the following link on the [place] website for more information: http://www.epilepsyfoundation.org/answerplace/Social/driving/drivingu.cfm   5.  Maintain good sleep hygiene.  6.  Notify your neurology if you are planning pregnancy or if you become pregnant.  7.  Contact your doctor if you have any problems that may be related to the medicine you are taking.  8.  Call 911 and bring the patient back to the ED if:        A.  The seizure lasts longer than 5 minutes.       B.  The patient doesn't awaken shortly after the seizure  C.  The patient has new problems such as difficulty seeing, speaking or moving  D.  The patient was injured during the seizure  E.  The patient has a temperature over 102 F (39C)  F.  The patient vomited and now is having trouble breathing
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
## 2014-03-07 ENCOUNTER — Telehealth: Payer: Self-pay | Admitting: Neurology

## 2014-03-07 MED ORDER — LAMOTRIGINE ER 25 MG PO TB24
ORAL_TABLET | ORAL | Status: DC
Start: 2014-03-07 — End: 2014-04-15

## 2014-03-07 NOTE — Telephone Encounter (Signed)
Returned call patient wanted to know if she is supposed to go ahead and start Lamictal xr 25mg . I did tell patient that Dr. Delice Lesch did want her to go ahead and start Rx. Rx sent to pharmacy with titration directions.

## 2014-03-07 NOTE — Telephone Encounter (Signed)
Pt has questions about when to start med. Please call at confirmed CB# [phone] / Sherri S.

## 2014-03-10 ENCOUNTER — Ambulatory Visit (HOSPITAL_COMMUNITY)
Admission: RE | Admit: 2014-03-10 | Discharge: 2014-03-10 | Disposition: A | Discharge status: Home / Self Care | Payer: 59 | Source: Ambulatory Visit | Attending: Neurology | Admitting: Neurology

## 2014-03-10 DIAGNOSIS — R569 Unspecified convulsions: Secondary | ICD-10-CM | POA: Diagnosis present

## 2014-03-10 DIAGNOSIS — R51 Headache: Secondary | ICD-10-CM | POA: Diagnosis present

## 2014-03-10 MED ORDER — GADOBENATE DIMEGLUMINE 529 MG/ML IV SOLN
18.0000 mL | Freq: Once | INTRAVENOUS | Status: AC | PRN
  Administered 2014-03-10: 18 mL via INTRAVENOUS

## 2014-03-25 ENCOUNTER — Other Ambulatory Visit: Payer: Self-pay | Admitting: Family Medicine

## 2014-03-25 ENCOUNTER — Telehealth: Payer: Self-pay | Admitting: Neurology

## 2014-03-25 MED ORDER — PHENYTOIN SODIUM EXTENDED 30 MG PO CAPS: 150.0000 mg | ORAL_CAPSULE | Freq: Every day | ORAL | Status: DC

## 2014-03-25 MED ORDER — PHENYTOIN SODIUM EXTENDED 100 MG PO CAPS: 100.0000 mg | ORAL_CAPSULE | Freq: Two times a day (BID) | ORAL | Status: DC

## 2014-03-25 NOTE — Telephone Encounter (Signed)
[phone] patient needs to talk to someone about how to take her medication please call

## 2014-03-25 NOTE — Telephone Encounter (Signed)
Spoke with patient and she wanted to know if she was still supposed to stay on the Dilantin now that she is taking the Lamictal. I did tell per Dr. Amparo Bristol note that she will be tapered off the the Dilantin once she is therapeutic with new med but for now to stay on current med. Told her she could discuss this further with Dr. Delice Lesch at her f/u on 12/11. Patient request that new rx be sent in for her Dilantin. Rx sent to pts pharmacy.

## 2014-03-28 ENCOUNTER — Telehealth: Payer: Self-pay | Admitting: *Deleted

## 2014-03-28 NOTE — Telephone Encounter (Signed)
Please give patient a call back would not say in reference to what?

## 2014-03-28 NOTE — Telephone Encounter (Signed)
Returned call. Patient wanted to check on status of Rx for Dilantin. Told patient Rx was faxed to her pharmacy this morning.

## 2014-04-15 ENCOUNTER — Ambulatory Visit (INDEPENDENT_AMBULATORY_CARE_PROVIDER_SITE_OTHER): Payer: 59 | Admitting: Neurology

## 2014-04-15 ENCOUNTER — Encounter: Payer: Self-pay | Admitting: Neurology

## 2014-04-15 VITALS — BP 118/82 | HR 104 | Resp 16 | Ht 58.75 in | Wt 206.0 lb

## 2014-04-15 DIAGNOSIS — G40009 Localization-related (focal) (partial) idiopathic epilepsy and epileptic syndromes with seizures of localized onset, not intractable, without status epilepticus: Secondary | ICD-10-CM

## 2014-04-15 MED ORDER — LAMOTRIGINE 100 MG PO TABS: ORAL_TABLET | ORAL | Status: DC

## 2014-04-15 NOTE — Patient Instructions (Signed)
1. After 2 weeks of taking Lamictal 100mg /day, increase to Lamictal 100mg  twice a day 2. After 2 weeks of taking higher dose Lamictal, start reducing Dilantin:  Take 100mg  in AM, 30mg  2 caps at 2pm, 100mg  at night for 2 weeks; then Take 100mg  in AM, stop 2pm dose, 100mg  at night for 2 weeks; then Take stop AM dose, take 100mg  at night for 2 weeks; then Stop Dilantin 3. Call our office for any problems  Seizure Precautions: 1. If medication has been prescribed for you to prevent seizures, take it exactly as directed.  Do not stop taking the medicine without talking to your doctor first, even if you have not had a seizure in a long time.   2. Avoid activities in which a seizure would cause danger to yourself or to others.  Don't operate dangerous machinery, swim alone, or climb in high or dangerous places, such as on ladders, roofs, or girders.  Do not drive unless your doctor says you may.  3. If you have any warning that you may have a seizure, lay down in a safe place where you can't hurt yourself.    4.  No driving for 6 months from last seizure, as per [facility]   Please refer to the following link on the [facility] website for more information: http://[email or web address].org/[email or web address]/driving/drivingu.cfm   5.  Maintain good sleep hygiene.  6.  Notify your neurology if you are planning pregnancy or if you become pregnant.  7.  Contact your doctor if you have any problems that may be related to the medicine you are taking.  8.  Call 911 and bring the patient back to the ED if:        A.  The seizure lasts longer than 5 minutes.       B.  The patient doesn't awaken shortly after the seizure  C.  The patient has new problems such as difficulty seeing, speaking or moving  D.  The patient was injured during the seizure  E.  The patient has a temperature over 102 F (39C)  F.  The patient vomited and now is having trouble breathing

## 2014-04-15 NOTE — Progress Notes (Signed)
NEUROLOGY FOLLOW UP OFFICE NOTE  Anne Benjamin [id number]  HISTORY OF PRESENT ILLNESS: I had the pleasure of seeing Anne Benjamin in follow-up in the neurology clinic on 04/15/2014.  The patient was last seen 6 weeks ago for recurrent seizures. Records and images were personally reviewed where available.  I personally reviewed MRI brain with and without contrast which was normal, hippocampi symmetric with no abnormal signal or enhancement. Her EEG had shown left temporal slowing. She had side effects on Keppra and had been taking Dilantin, however with plans for pregnancy, we had agreed to start uptitration of Lamictal then wean off Dilantin. She had increased Lamictal XR to 100mg /day this week. Since her last visit, she had a nocturnal seizure at 11/29 at 8am, she was asleep and witnessed by her husband to have a generalized convulsion lasting 20-35 seconds. She woke up feeling tired with tongue bite. She had been sleep deprived prior preparing for Thanksgiving.They have noticed that her recovery time is shorter since starting Lamictal. No staring/unresponsive episodes, focal numbness/tingling or weakness, no headaches. She is tolerating Lamictal with no rash, dizziness, diplopia, or gait instability.  HPI: This is a pleasant 33 yo RH woman with seizures since 09/01/2012. She was at home when her husband heard her fall on the floor and found her shaking with all extremities extended. She had told him she felt tired that day after being up for 72 hours studying and finishing a paper for school. She woke up with EMS around her, feeling tired, no focal weakness. Per EMS report, she had a post-ictal phase of approximately 30 minutes. CBC showed a WBC of 10.7, BMP unremarkable. She had seen neurologist Dr. Jannifer Franklin and routine EEG done showed intermittent dysrhythmic theta slowing from the left mid-temporal region, with occasional sharp transients. At no time did there appear to be evidence of spike or spike wave  discharges. She was unable to do MRI brain ordered. She was started on Keppra 500mg  BID, however she felt very sleepy and unable to function on this, and had self-reduced dose to 250mg  daily. She had been on this dose and had another seizure that occurred out of sleep in June/July 2015. Her husband heard her shaking, with urinary incontinence and tongue bite. She may have been sleep-deprived that time. She did not seek medical attention. On 02/24/14, she was asleep at 4am when her husband woke up to her having a convulsion that lasted 30 seconds. She went back to sleep. He came back in the room at 10am and she recalls asking him if she had another seizure, he reported that she looked like she had another one and there was a wet spot on her pajamas. Around noon, he heard a noise but thought it was a dog outside. Then at 3:30pm, he was in the room and again heard the same noise and found her having another convulsion. She was brought to [facility] where she was noted to be febrile with rectal temperature of 101.3. Her WBC was 17.2. She had a head CT which I personally reviewed which was normal. She had a lumbar puncture which was traumatic with 700 RBC pink hazy CSF, 2 WBC, protein 13, glucose 85, gram stain and culture, VDRL, HSV, and Cryptococcal Ab negative. She was started on Dilantin, last level 8.0, discharged home on Dilantin 100mg  in AM, 150mg  at 3pm, 100mg  qhs. She reports feeling a little drowsy on this. She had started a new job a month ago and reports being stressed recently. She  usually gets 6-7 hours of sleep, denied sleep deprivation prior to the recent seizures. She had a glass of wine the night prior.   Her husband had noticed infrequent blanking out episodes lasting 10 seconds a year ago, none recently. She denies any gaps in time, no olfactory/gustatory hallucinations, deja vu, rising epigastric sensation, focal numbness/tingling/weakness, myoclonic jerks.   Epilepsy Risk Factors: Her paternal  half-sister has seizures. She had 2 concussions in her 64s, one with loss of consciousness. Otherwise she had a normal birth and early development. There is no history of febrile convulsions, CNS infections such as meningitis/encephalitis, significant traumatic brain injury, neurosurgical procedures.  Prior AEDs: Keppra  PAST MEDICAL HISTORY: Past Medical History  Diagnosis Date  . Seizures   . Convulsions/seizures 09/03/2012  . Obesity     MEDICATIONS: Current Outpatient Prescriptions on File Prior to Visit  Medication Sig Dispense Refill  . Multiple Vitamin (MULTIVITAMIN) tablet Take 1 tablet by mouth daily.    . phenytoin (DILANTIN) 100 MG ER capsule Take 1 capsule (100 mg total) by mouth 2 (two) times daily. 60 capsule 0  . phenytoin (DILANTIN) 30 MG ER capsule Take 5 capsules (150 mg total) by mouth daily at 2 PM daily at 2 PM. 150 capsule 0  Lamictal XR 25mg  4 tabs daily No current facility-administered medications on file prior to visit.    ALLERGIES: Allergies  Allergen Reactions  . Orange Oil     unknown  . Penicillins     Pt states she experiences nausea and vomiting    FAMILY HISTORY: Family History  Problem Relation Age of Onset  . Hypertension Mother     SOCIAL HISTORY: History   Social History  . Marital Status: Married    Spouse Name: N/A    Number of Children: 1  . Years of Education: S-College   Occupational History  . Not on file.   Social History Main Topics  . Smoking status: Never Smoker   . Smokeless tobacco: Not on file  . Alcohol Use: Yes     Comment: occasional  . Drug Use: No  . Sexual Activity: Not on file   Other Topics Concern  . Not on file   Social History Narrative    REVIEW OF SYSTEMS: Constitutional: No fevers, chills, or sweats, no generalized fatigue, change in appetite Eyes: No visual changes, double vision, eye pain Ear, nose and throat: No hearing loss, ear pain, nasal congestion, sore throat Cardiovascular: No  chest pain, palpitations Respiratory:  No shortness of breath at rest or with exertion, wheezes GastrointestinaI: No nausea, vomiting, diarrhea, abdominal pain, fecal incontinence Genitourinary:  No dysuria, urinary retention or frequency Musculoskeletal:  No neck pain, back pain Integumentary: No rash, pruritus, skin lesions Neurological: as above Psychiatric: No depression, insomnia, anxiety Endocrine: No palpitations, fatigue, diaphoresis, mood swings, change in appetite, change in weight, increased thirst Hematologic/Lymphatic:  No anemia, purpura, petechiae. Allergic/Immunologic: no itchy/runny eyes, nasal congestion, recent allergic reactions, rashes  PHYSICAL EXAM: Filed Vitals:   04/15/14 1058  BP: 118/82  Pulse: 104  Resp: 16   General: No acute distress Head:  Normocephalic/atraumatic Neck: supple, no paraspinal tenderness, full range of motion Heart:  Regular rate and rhythm Lungs:  Clear to auscultation bilaterally Back: No paraspinal tenderness Skin/Extremities: No rash, no edema Neurological Exam: alert and oriented to person, place, and time. No aphasia or dysarthria. Fund of knowledge is appropriate.  Recent and remote memory are intact.  Attention and concentration are normal.  Able to name objects and repeat phrases. Cranial nerves: Pupils equal, round, reactive to light.  Fundoscopic exam unremarkable, no papilledema. Extraocular movements intact with no nystagmus. Visual fields full. Facial sensation intact. No facial asymmetry. Tongue, uvula, palate midline.  Motor: Bulk and tone normal, muscle strength 5/5 throughout with no pronator drift.  Sensation to light touch intact.  No extinction to double simultaneous stimulation.  Deep tendon reflexes 2+ throughout, toes downgoing.  Finger to nose testing intact.  Gait narrow-based and steady, able to tandem walk adequately.  Romberg negative.  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 Routine EEG in 2014 had shown left temporal slowing, MRI brain normal. Last seizure on 04/02/14 triggered by sleep deprivation. She is now on Lamictal XR 100mg /day and continues on Dilantin 350mg /day. She will increase Lamictal to 200mg /day, XR formulation has been cost prohibitive, she will take Lamotrigine immediate release 100mg  BID. After 2 weeks of taking higher dose Lamictal, she will start weaning off Dilantin as instructed. We discussed risks of breakthrough seizures during any medication adjustment. She will start taking folic acid daily. She is aware of [place] driving laws and does not drive. She will follow-up in 3 months.   Thank you for allowing me to participate in her care.  Please do not hesitate to call for any questions or concerns.  The duration of this appointment visit was 25 minutes of face-to-face time with the patient.  Greater than 50% of this time was spent in counseling, explanation of diagnosis, planning of further management, and coordination of care.   Ellouise Newer, M.D.

## 2014-04-19 ENCOUNTER — Encounter: Payer: Self-pay | Admitting: Neurology

## 2014-04-25 ENCOUNTER — Telehealth: Payer: Self-pay | Admitting: Neurology

## 2014-04-25 MED ORDER — PHENYTOIN SODIUM EXTENDED 30 MG PO CAPS: 150.0000 mg | ORAL_CAPSULE | Freq: Every day | ORAL | Status: DC

## 2014-04-25 MED ORDER — PHENYTOIN SODIUM EXTENDED 100 MG PO CAPS: 100.0000 mg | ORAL_CAPSULE | Freq: Two times a day (BID) | ORAL | Status: DC

## 2014-04-25 NOTE — Telephone Encounter (Signed)
Rx refills sent to patients pharmacy

## 2014-04-25 NOTE — Telephone Encounter (Signed)
Rx's sent to patient's pharmacy   

## 2014-04-25 NOTE — Telephone Encounter (Signed)
Pt called requesting a refill for DILANTIN 100mg  Pharmacy: [facility] [address] Nolon Rod [phone]

## 2014-05-24 ENCOUNTER — Other Ambulatory Visit: Payer: Self-pay | Admitting: Family Medicine

## 2014-05-24 ENCOUNTER — Telehealth: Payer: Self-pay | Admitting: Neurology

## 2014-05-24 MED ORDER — PHENYTOIN SODIUM EXTENDED 100 MG PO CAPS: 100.0000 mg | ORAL_CAPSULE | Freq: Two times a day (BID) | ORAL | Status: DC

## 2014-05-24 NOTE — Telephone Encounter (Signed)
Rx for Dilantin was sent to patient's pharmacy. Told patient she already has refills left on her lamictal Rx.

## 2014-05-24 NOTE — Telephone Encounter (Signed)
Pt needs refills on dilantin 100 mg and lamictal 100mg  called in to the [facility].Marland Kitchen Pt phone number is [phone]

## 2014-07-15 ENCOUNTER — Ambulatory Visit (INDEPENDENT_AMBULATORY_CARE_PROVIDER_SITE_OTHER): Payer: BLUE CROSS/BLUE SHIELD | Admitting: Neurology

## 2014-07-15 ENCOUNTER — Encounter: Payer: Self-pay | Admitting: Neurology

## 2014-07-15 VITALS — BP 116/84 | HR 82 | Resp 16 | Ht 58.75 in | Wt 203.0 lb

## 2014-07-15 DIAGNOSIS — G40009 Localization-related (focal) (partial) idiopathic epilepsy and epileptic syndromes with seizures of localized onset, not intractable, without status epilepticus: Secondary | ICD-10-CM | POA: Diagnosis not present

## 2014-07-15 MED ORDER — LAMOTRIGINE 100 MG PO TABS: ORAL_TABLET | ORAL | Status: DC

## 2014-07-15 NOTE — Progress Notes (Signed)
NEUROLOGY FOLLOW UP OFFICE NOTE  Sofia Vanmeter [id number]  HISTORY OF PRESENT ILLNESS: I had the pleasure of seeing Kambrie Macpherson in follow-up in the neurology clinic on 07/15/2014.  The patient was last seen 3 months ago for recurrent seizures. She is again accompanied by her husband today.On her last visit, Lamictal dose was increased to 100mg  BID. She has tapered off Dilantin and feels so much better. She and her husband deny any further seizures since 04/02/14. They deny any staring/unresponsive episodes, no gaps in time, headaches, dizziness, diplopia, gait instability. No olfactory/gustatory hallucinations, focal numbness/tingling/weakness, myoclonic jerks. She denies any falls. They are potentially planning for pregnancy toward the end of this year.  HPI: This is a pleasant 34 yo RH woman with seizures since 09/01/2012. She was at home when her husband heard her fall on the floor and found her shaking with all extremities extended. She had told him she felt tired that day after being up for 72 hours studying and finishing a paper for school. She woke up with EMS around her, feeling tired, no focal weakness. Per EMS report, she had a post-ictal phase of approximately 30 minutes. CBC showed a WBC of 10.7, BMP unremarkable. She had seen neurologist Dr. Jannifer Franklin and routine EEG done showed intermittent dysrhythmic theta slowing from the left mid-temporal region, with occasional sharp transients. At no time did there appear to be evidence of spike or spike wave discharges. She was unable to do MRI brain ordered. She was started on Keppra 500mg  BID, however she felt very sleepy and unable to function on this, and had self-reduced dose to 250mg  daily. She had been on this dose and had another seizure that occurred out of sleep in June/July 2015. Her husband heard her shaking, with urinary incontinence and tongue bite. She may have been sleep-deprived that time. She did not seek medical attention. On 02/24/14, she  was asleep at 4am when her husband woke up to her having a convulsion that lasted 30 seconds. She went back to sleep. He came back in the room at 10am and she recalls asking him if she had another seizure, he reported that she looked like she had another one and there was a wet spot on her pajamas. Around noon, he heard a noise but thought it was a dog outside. Then at 3:30pm, he was in the room and again heard the same noise and found her having another convulsion. She was brought to [facility] where she was noted to be febrile with rectal temperature of 101.3. Her WBC was 17.2. She had a head CT which I personally reviewed which was normal. She had a lumbar puncture which was traumatic with 700 RBC pink hazy CSF, 2 WBC, protein 13, glucose 85, gram stain and culture, VDRL, HSV, and Cryptococcal Ab negative. She was started on Dilantin, last level 8.0, discharged home on Dilantin 100mg  in AM, 150mg  at 3pm, 100mg  qhs which also caused drowsiness. She was started on Lamictal in October 2015.   Her husband had noticed infrequent blanking out episodes lasting 10 seconds a year ago, none recently. She denies any gaps in time, no olfactory/gustatory hallucinations, deja vu, rising epigastric sensation, focal numbness/tingling/weakness, myoclonic jerks.   Epilepsy Risk Factors: Her paternal half-sister has seizures. She had 2 concussions in her 36s, one with loss of consciousness. Otherwise she had a normal birth and early development. There is no history of febrile convulsions, CNS infections such as meningitis/encephalitis, significant traumatic brain injury, neurosurgical procedures.  Prior  AEDs: Keppra, Dilantin  PAST MEDICAL HISTORY: Past Medical History  Diagnosis Date  . Seizures   . Convulsions/seizures 09/03/2012  . Obesity     MEDICATIONS: Current Outpatient Prescriptions on File Prior to Visit  Medication Sig Dispense Refill  . lamoTRIgine (LAMICTAL) 100 MG tablet Take 1 tablet twice a day 60 tablet  4  . Multiple Vitamin (MULTIVITAMIN) tablet Take 1 tablet by mouth daily.     No current facility-administered medications on file prior to visit.    ALLERGIES: Allergies  Allergen Reactions  . Orange Oil     unknown  . Penicillins     Pt states she experiences nausea and vomiting    FAMILY HISTORY: Family History  Problem Relation Age of Onset  . Hypertension Mother     SOCIAL HISTORY: History   Social History  . Marital Status: Married    Spouse Name: N/A  . Number of Children: 1  . Years of Education: S-College   Occupational History  . Not on file.   Social History Main Topics  . Smoking status: Never Smoker   . Smokeless tobacco: Not on file  . Alcohol Use: Yes     Comment: occasional  . Drug Use: No  . Sexual Activity: Not on file   Other Topics Concern  . Not on file   Social History Narrative    REVIEW OF SYSTEMS: Constitutional: No fevers, chills, or sweats, no generalized fatigue, change in appetite Eyes: No visual changes, double vision, eye pain Ear, nose and throat: No hearing loss, ear pain, nasal congestion, sore throat Cardiovascular: No chest pain, palpitations Respiratory:  No shortness of breath at rest or with exertion, wheezes GastrointestinaI: No nausea, vomiting, diarrhea, abdominal pain, fecal incontinence Genitourinary:  No dysuria, urinary retention or frequency Musculoskeletal:  No neck pain, back pain Integumentary: No rash, pruritus, skin lesions Neurological: as above Psychiatric: No depression, insomnia, anxiety Endocrine: No palpitations, fatigue, diaphoresis, mood swings, change in appetite, change in weight, increased thirst Hematologic/Lymphatic:  No anemia, purpura, petechiae. Allergic/Immunologic: no itchy/runny eyes, nasal congestion, recent allergic reactions, rashes  PHYSICAL EXAM: Filed Vitals:   07/15/14 0928  BP: 116/84  Pulse: 82  Resp: 16   General: No acute distress Head:   Normocephalic/atraumatic Neck: supple, no paraspinal tenderness, full range of motion Heart:  Regular rate and rhythm Lungs:  Clear to auscultation bilaterally Back: No paraspinal tenderness Skin/Extremities: No rash, no edema Neurological Exam: alert and oriented to person, place, and time. No aphasia or dysarthria. Fund of knowledge is appropriate.  Recent and remote memory are intact.  Attention and concentration are normal.    Able to name objects and repeat phrases. Cranial nerves: Pupils equal, round, reactive to light.  Fundoscopic exam unremarkable, no papilledema. Extraocular movements intact with no nystagmus. Visual fields full. Facial sensation intact. No facial asymmetry. Tongue, uvula, palate midline.  Motor: Bulk and tone normal, muscle strength 5/5 throughout with no pronator drift.  Sensation to light touch intact.  No extinction to double simultaneous stimulation.  Deep tendon reflexes 2+ throughout, toes downgoing.  Finger to nose testing intact.  Gait narrow-based and steady, able to tandem walk adequately.  Romberg negative.  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 Routine EEG in 2014 had shown left temporal slowing, MRI brain normal. Last seizure on 04/02/14 triggered by sleep deprivation. She is now on Lamictal 100mg  BID with  no seizures since November, no side effects. Baseline Lamictal level will be done. She is planning for pregnancy this year and will start daily folic acid 1mg . She is aware of [place] driving laws to stop driving until 6 months seizure-free. She will follow-up in 4 months and knows to call our office for any changes.   Thank you for allowing me to participate in her care.  Please do not hesitate to call for any questions or concerns.  The duration of this appointment visit was 15 minutes of face-to-face time with the patient.   Greater than 50% of this time was spent in counseling, explanation of diagnosis, planning of further management, and coordination of care.   Ellouise Newer, M.D.   CC: Dr. Justin Mend

## 2014-07-15 NOTE — Patient Instructions (Signed)
1. Continue Lamictal 100mg  twice a day 2. Start daily folic acid 1mg  3. Bloodwork for Lamictal level 4. Follow-up in 4 months, call our office for any problems  Seizure Precautions: 1. If medication has been prescribed for you to prevent seizures, take it exactly as directed.  Do not stop taking the medicine without talking to your doctor first, even if you have not had a seizure in a long time.   2. Avoid activities in which a seizure would cause danger to yourself or to others.  Don't operate dangerous machinery, swim alone, or climb in high or dangerous places, such as on ladders, roofs, or girders.  Do not drive unless your doctor says you may.  3. If you have any warning that you may have a seizure, lay down in a safe place where you can't hurt yourself.    4.  No driving for 6 months from last seizure, as per [facility].   Please refer to the following link on the [place] website for more information: http://www.epilepsyfoundation.org/answerplace/Social/driving/drivingu.cfm   5.  Maintain good sleep hygiene.  6.  Notify your neurology if you are planning pregnancy or if you become pregnant.  7.  Contact your doctor if you have any problems that may be related to the medicine you are taking.  8.  Call 911 and bring the patient back to the ED if:        A.  The seizure lasts longer than 5 minutes.       B.  The patient doesn't awaken shortly after the seizure  C.  The patient has new problems such as difficulty seeing, speaking or moving  D.  The patient was injured during the seizure  E.  The patient has a temperature over 102 F (39C)  F.  The patient vomited and now is having trouble breathing

## 2014-07-21 LAB — LAMOTRIGINE LEVEL: LAMOTRIGINE LVL: 5.3 ug/mL (ref 4.0–18.0)

## 2014-11-14 ENCOUNTER — Ambulatory Visit: Payer: BLUE CROSS/BLUE SHIELD | Admitting: Neurology

## 2014-12-08 ENCOUNTER — Ambulatory Visit: Payer: BLUE CROSS/BLUE SHIELD | Admitting: Neurology

## 2015-01-23 ENCOUNTER — Telehealth: Payer: Self-pay | Admitting: Neurology

## 2015-01-23 DIAGNOSIS — G40009 Localization-related (focal) (partial) idiopathic epilepsy and epileptic syndromes with seizures of localized onset, not intractable, without status epilepticus: Secondary | ICD-10-CM

## 2015-01-23 MED ORDER — LAMOTRIGINE 100 MG PO TABS: ORAL_TABLET | ORAL | Status: DC

## 2015-01-23 NOTE — Telephone Encounter (Signed)
I spoke with patient. She states she picked up last refill on her Lamictal and it stated on the bottle that anymore refills would need to be auth by her doctor. I told her that it just meant that she was out of refills and she would need to have a new Rx submitted by Dr. Delice Lesch. I did remind her that she was due for an ov and we could go ahead and schedule that appt if she was able to at this time. We did scheduled f/u appt for 10/14. She asked how much it would be out of pocket for visit as she states that she doesn't have insurance anymore.   She will still be able to be seen. She would have to make some type of payment on the day she comes in for appt.

## 2015-01-23 NOTE — Telephone Encounter (Signed)
Pt called and has a question regarding her medication Lamotrigine/Dawn CB# [id number]

## 2015-01-23 NOTE — Telephone Encounter (Signed)
Pt returned your call/ call back@ [phone]

## 2015-02-17 ENCOUNTER — Encounter: Payer: Self-pay | Admitting: Neurology

## 2015-02-17 ENCOUNTER — Ambulatory Visit (INDEPENDENT_AMBULATORY_CARE_PROVIDER_SITE_OTHER): Payer: Self-pay | Admitting: Neurology

## 2015-02-17 VITALS — BP 128/88 | HR 97 | Ht <= 58 in | Wt 214.0 lb

## 2015-02-17 DIAGNOSIS — G40009 Localization-related (focal) (partial) idiopathic epilepsy and epileptic syndromes with seizures of localized onset, not intractable, without status epilepticus: Secondary | ICD-10-CM

## 2015-02-17 MED ORDER — LAMOTRIGINE 100 MG PO TABS: ORAL_TABLET | ORAL | Status: DC

## 2015-02-17 NOTE — Patient Instructions (Signed)
1. Continue Lamotrigine 100mg  twice a day 2. Start daily folic acid 3. Follow-up in 1 year or earlier if needed  Seizure Precautions: 1. If medication has been prescribed for you to prevent seizures, take it exactly as directed.  Do not stop taking the medicine without talking to your doctor first, even if you have not had a seizure in a long time.   2. Avoid activities in which a seizure would cause danger to yourself or to others.  Don't operate dangerous machinery, swim alone, or climb in high or dangerous places, such as on ladders, roofs, or girders.  Do not drive unless your doctor says you may.  3. If you have any warning that you may have a seizure, lay down in a safe place where you can't hurt yourself.    4.  No driving for 6 months from last seizure, as per [facility].   Please refer to the following link on the [place] website for more information: http://www.epilepsyfoundation.org/answerplace/Social/driving/drivingu.cfm   5.  Maintain good sleep hygiene. Avoid alcohol.  6.  Notify your neurology if you are planning pregnancy or if you become pregnant.  7.  Contact your doctor if you have any problems that may be related to the medicine you are taking.  8.  Call 911 and bring the patient back to the ED if:        A.  The seizure lasts longer than 5 minutes.       B.  The patient doesn't awaken shortly after the seizure  C.  The patient has new problems such as difficulty seeing, speaking or moving  D.  The patient was injured during the seizure  E.  The patient has a temperature over 102 F (39C)  F.  The patient vomited and now is having trouble breathing

## 2015-02-17 NOTE — Progress Notes (Signed)
NEUROLOGY FOLLOW UP OFFICE NOTE  Anne Benjamin [id number]  HISTORY OF PRESENT ILLNESS: I had the pleasure of seeing Alee Calabrese in follow-up in the neurology clinic on 02/17/2015. She is again accompanied by her husband who helps supplement the history today. The patient was last seen 7 months ago for recurrent seizures, likely arising from the left temporal lobe. She denies any further seizures since 04/02/14, doing well on Lamotrigine 100mg  BID with no side effects. They deny any staring/unresponsive episodes, no gaps in time, headaches, dizziness, diplopia, gait instability. No olfactory/gustatory hallucinations, focal numbness/tingling/weakness, myoclonic jerks. She denies any falls. They are potentially planning for pregnancy next year after she loses weight.  Lamictal level 07/19/14: 5.3.  HPI: This is a pleasant 34 yo RH woman with seizures since 09/01/2012. She was at home when her husband heard her fall on the floor and found her shaking with all extremities extended. She had told him she felt tired that day after being up for 72 hours studying and finishing a paper for school. She woke up with EMS around her, feeling tired, no focal weakness. Per EMS report, she had a post-ictal phase of approximately 30 minutes. CBC showed a WBC of 10.7, BMP unremarkable. She had seen neurologist Dr. Jannifer Franklin and routine EEG done showed intermittent dysrhythmic theta slowing from the left mid-temporal region, with occasional sharp transients. At no time did there appear to be evidence of spike or spike wave discharges. She was unable to do MRI brain ordered. She was started on Keppra 500mg  BID, however she felt very sleepy and unable to function on this, and had self-reduced dose to 250mg  daily. She had been on this dose and had another seizure that occurred out of sleep in June/July 2015. Her husband heard her shaking, with urinary incontinence and tongue bite. She may have been sleep-deprived that time. She did  not seek medical attention. On 02/24/14, she was asleep at 4am when her husband woke up to her having a convulsion that lasted 30 seconds. She went back to sleep. He came back in the room at 10am and she recalls asking him if she had another seizure, he reported that she looked like she had another one and there was a wet spot on her pajamas. Around noon, he heard a noise but thought it was a dog outside. Then at 3:30pm, he was in the room and again heard the same noise and found her having another convulsion. She was brought to [facility] where she was noted to be febrile with rectal temperature of 101.3. Her WBC was 17.2. She had a head CT which I personally reviewed which was normal. She had a lumbar puncture which was traumatic with 700 RBC pink hazy CSF, 2 WBC, protein 13, glucose 85, gram stain and culture, VDRL, HSV, and Cryptococcal Ab negative. She was started on Dilantin, last level 8.0, discharged home on Dilantin 100mg  in AM, 150mg  at 3pm, 100mg  qhs which also caused drowsiness. She was started on Lamictal in October 2015.   Her husband had noticed infrequent blanking out episodes lasting 10 seconds a year ago, none recently. She denies any gaps in time, no olfactory/gustatory hallucinations, deja vu, rising epigastric sensation, focal numbness/tingling/weakness, myoclonic jerks.   Epilepsy Risk Factors: Her paternal half-sister has seizures. She had 2 concussions in her 57s, one with loss of consciousness. Otherwise she had a normal birth and early development. There is no history of febrile convulsions, CNS infections such as meningitis/encephalitis, significant traumatic brain injury, neurosurgical  procedures.  Prior AEDs: Keppra, Dilantin  PAST MEDICAL HISTORY: Past Medical History  Diagnosis Date  . Seizures ([place])   . Convulsions/seizures ([place]) 09/03/2012  . Obesity     MEDICATIONS: Current Outpatient Prescriptions on File Prior to Visit  Medication Sig Dispense Refill  . lamoTRIgine  (LAMICTAL) 100 MG tablet Take 1 tablet twice a day 60 tablet 2  . Multiple Vitamin (MULTIVITAMIN) tablet Take 1 tablet by mouth daily.     No current facility-administered medications on file prior to visit.    ALLERGIES: Allergies  Allergen Reactions  . Orange Oil     unknown  . Penicillins     Pt states she experiences nausea and vomiting    FAMILY HISTORY: Family History  Problem Relation Age of Onset  . Hypertension Mother     SOCIAL HISTORY: Social History   Social History  . Marital Status: Married    Spouse Name: N/A  . Number of Children: 1  . Years of Education: S-College   Occupational History  . Not on file.   Social History Main Topics  . Smoking status: Never Smoker   . Smokeless tobacco: Not on file  . Alcohol Use: Yes     Comment: occasional  . Drug Use: No  . Sexual Activity: Not on file   Other Topics Concern  . Not on file   Social History Narrative    REVIEW OF SYSTEMS: Constitutional: No fevers, chills, or sweats, no generalized fatigue, change in appetite Eyes: No visual changes, double vision, eye pain Ear, nose and throat: No hearing loss, ear pain, nasal congestion, sore throat Cardiovascular: No chest pain, palpitations Respiratory:  No shortness of breath at rest or with exertion, wheezes GastrointestinaI: No nausea, vomiting, diarrhea, abdominal pain, fecal incontinence Genitourinary:  No dysuria, urinary retention or frequency Musculoskeletal:  No neck pain, back pain Integumentary: No rash, pruritus, skin lesions Neurological: as above Psychiatric: No depression, insomnia, anxiety Endocrine: No palpitations, fatigue, diaphoresis, mood swings, change in appetite, change in weight, increased thirst Hematologic/Lymphatic:  No anemia, purpura, petechiae. Allergic/Immunologic: no itchy/runny eyes, nasal congestion, recent allergic reactions, rashes  PHYSICAL EXAM: Filed Vitals:   02/17/15 1120  BP: 128/88  Pulse: 97    General: No acute distress Head:  Normocephalic/atraumatic Neck: supple, no paraspinal tenderness, full range of motion Heart:  Regular rate and rhythm Lungs:  Clear to auscultation bilaterally Back: No paraspinal tenderness Skin/Extremities: No rash, no edema Neurological Exam: alert and oriented to person, place, and time. No aphasia or dysarthria. Fund of knowledge is appropriate.  Recent and remote memory are intact. 3/3 delayed recall.  Attention and concentration are normal.    Able to name objects and repeat phrases. Cranial nerves: Pupils equal, round, reactive to light.  Fundoscopic exam unremarkable, no papilledema. Extraocular movements intact with no nystagmus. Visual fields full. Facial sensation intact. No facial asymmetry. Tongue, uvula, palate midline.  Motor: Bulk and tone normal, muscle strength 5/5 throughout with no pronator drift.  Sensation to light touch intact.  No extinction to double simultaneous stimulation.  Deep tendon reflexes 2+ throughout, toes downgoing.  Finger to nose testing intact.  Gait narrow-based and steady, able to tandem walk adequately.  Romberg negative.  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 Routine EEG in 2014 had shown left temporal slowing, MRI brain normal. Last seizure on 04/02/14 triggered by sleep deprivation. She is  now on Lamictal 100mg  BID with no seizures since November 2015, no side effects. Continue current dose, refills sent today. She is planning for pregnancy next year and will start daily folic acid 1mg . She is aware of [place] driving laws to stop driving until 6 months seizure-free. She will follow-up in 1 year and knows to call our office for any changes.   Thank you for allowing me to participate in her care.  Please do not hesitate to call for any questions or concerns.  The duration of this appointment  visit was 15 minutes of face-to-face time with the patient.  Greater than 50% of this time was spent in counseling, explanation of diagnosis, planning of further management, and coordination of care.   Ellouise Newer, M.D.   CC: Dr. Justin Mend

## 2015-10-05 ENCOUNTER — Encounter: Payer: Self-pay | Admitting: Family Medicine

## 2016-02-16 ENCOUNTER — Other Ambulatory Visit: Payer: Self-pay | Admitting: Neurology

## 2016-02-16 DIAGNOSIS — G40009 Localization-related (focal) (partial) idiopathic epilepsy and epileptic syndromes with seizures of localized onset, not intractable, without status epilepticus: Secondary | ICD-10-CM

## 2016-02-16 NOTE — Telephone Encounter (Signed)
RX sent to pharmacy  

## 2016-02-19 ENCOUNTER — Ambulatory Visit: Payer: Self-pay | Admitting: Neurology

## 2016-02-21 ENCOUNTER — Ambulatory Visit (INDEPENDENT_AMBULATORY_CARE_PROVIDER_SITE_OTHER): Payer: 59 | Admitting: Neurology

## 2016-02-21 ENCOUNTER — Encounter: Payer: Self-pay | Admitting: Neurology

## 2016-02-21 VITALS — BP 118/74 | HR 97 | Ht <= 58 in | Wt 204.2 lb

## 2016-02-21 DIAGNOSIS — G40009 Localization-related (focal) (partial) idiopathic epilepsy and epileptic syndromes with seizures of localized onset, not intractable, without status epilepticus: Secondary | ICD-10-CM | POA: Diagnosis not present

## 2016-02-21 MED ORDER — LAMOTRIGINE 100 MG PO TABS: 100.0000 mg | ORAL_TABLET | Freq: Two times a day (BID) | ORAL | 3 refills | Status: DC

## 2016-02-21 NOTE — Patient Instructions (Addendum)
1. Continue Lamotrigine 100mg  twice a day 2. Follow-up in 1 year, call for any changes  Seizure Precautions: 1. If medication has been prescribed for you to prevent seizures, take it exactly as directed.  Do not stop taking the medicine without talking to your doctor first, even if you have not had a seizure in a long time.   2. Avoid activities in which a seizure would cause danger to yourself or to others.  Don't operate dangerous machinery, swim alone, or climb in high or dangerous places, such as on ladders, roofs, or girders.  Do not drive unless your doctor says you may.  3. If you have any warning that you may have a seizure, lay down in a safe place where you can't hurt yourself.    4.  No driving for 6 months from last seizure, as per [facility].   Please refer to the following link on the [place] website for more information: http://www.epilepsyfoundation.org/answerplace/Social/driving/drivingu.cfm   5.  Maintain good sleep hygiene. Avoid alcohol.  6.  Notify your neurology if you are planning pregnancy or if you become pregnant.  7.  Contact your doctor if you have any problems that may be related to the medicine you are taking.  8.  Call 911 and bring the patient back to the ED if:        A.  The seizure lasts longer than 5 minutes.       B.  The patient doesn't awaken shortly after the seizure  C.  The patient has new problems such as difficulty seeing, speaking or moving  D.  The patient was injured during the seizure  E.  The patient has a temperature over 102 F (39C)  F.  The patient vomited and now is having trouble breathing

## 2016-02-21 NOTE — Progress Notes (Signed)
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 She denies any further seizures since 04/02/14, doing well on Lamotrigine 100mg  BID with no side effects. She denies any staring/unresponsive episodes, no gaps in time, headaches, dizziness, diplopia, gait instability. No olfactory/gustatory hallucinations, focal numbness/tingling/weakness, myoclonic jerks. She denies any falls. No current pregnancy plans, she takes folic acid daily.  HPI: This is a pleasant 35 yo RH woman with seizures since 09/01/2012. She was at home when her husband heard her fall on the floor and found her shaking with all extremities extended. She had told him she felt tired that day after being up for 72 hours studying and finishing a paper for school. She woke up with EMS around her, feeling tired, no focal weakness. Per EMS report, she had a post-ictal phase of approximately 30 minutes. CBC showed a WBC of 10.7, BMP unremarkable. She had seen neurologist Dr. Jannifer Franklin and routine EEG done showed intermittent dysrhythmic theta slowing from the left mid-temporal region, with occasional sharp transients. At no time did there appear to be evidence of spike or spike wave discharges. She was unable to do MRI brain ordered. She was started on Keppra 500mg  BID, however she felt very sleepy and unable to function on this, and had self-reduced dose to 250mg  daily. She had been on this dose and had another seizure that occurred out of sleep in June/July 2015. Her husband heard her shaking, with urinary incontinence and tongue bite. She may have been sleep-deprived that time. She did not seek medical attention. On 02/24/14, she was asleep at 4am when her husband woke up to her having a convulsion that lasted 30 seconds.  She went back to sleep. He came back in the room at 10am and she recalls asking him if she had another seizure, he reported that she looked like she had another one and there was a wet spot on her pajamas. Around noon, he heard a noise but thought it was a dog outside. Then at 3:30pm, he was in the room and again heard the same noise and found her having another convulsion. She was brought to [facility] where she was noted to be febrile with rectal temperature of 101.3. Her WBC was 17.2. She had a head CT which I personally reviewed which was normal. She had a lumbar puncture which was traumatic with 700 RBC pink hazy CSF, 2 WBC, protein 13, glucose 85, gram stain and culture, VDRL, HSV, and Cryptococcal Ab negative. She was started on Dilantin, last level 8.0, discharged home on Dilantin 100mg  in AM, 150mg  at 3pm, 100mg  qhs which also caused drowsiness. She was started on Lamictal in October 2015.   Her husband had noticed infrequent blanking out episodes lasting 10 seconds a year ago, none recently. She denies any gaps in time, no olfactory/gustatory hallucinations, deja vu, rising epigastric sensation, focal numbness/tingling/weakness, myoclonic jerks.   Epilepsy Risk Factors: Her paternal half-sister has seizures. She had 2 concussions in her 109s, one with loss of consciousness. Otherwise she had a normal birth and early development. There is no history of febrile convulsions, CNS infections such as meningitis/encephalitis, significant traumatic brain injury, neurosurgical procedures.  Prior AEDs: Keppra, Dilantin  Lamictal level 07/19/14: 5.3.  PAST MEDICAL HISTORY: Past Medical History:  Diagnosis Date  .  Convulsions/seizures ([place]) 09/03/2012  . Obesity   . Seizures ([place])     MEDICATIONS: Current Outpatient Prescriptions on File Prior to Visit  Medication Sig Dispense Refill  . lamoTRIgine (LAMICTAL) 100 MG tablet TAKE ONE TABLET BY MOUTH TWICE DAILY 180 tablet 3  . Multiple Vitamin  (MULTIVITAMIN) tablet Take 1 tablet by mouth daily.     No current facility-administered medications on file prior to visit.     ALLERGIES: Allergies  Allergen Reactions  . Orange Oil     unknown  . Penicillins     Pt states she experiences nausea and vomiting    FAMILY HISTORY: Family History  Problem Relation Age of Onset  . Hypertension Mother     SOCIAL HISTORY: Social History   Social History  . Marital status: Married    Spouse name: N/A  . Number of children: 1  . Years of education: S-College   Occupational History  . Not on file.   Social History Main Topics  . Smoking status: Never Smoker  . Smokeless tobacco: Not on file  . Alcohol use Yes     Comment: occasional  . Drug use: No  . Sexual activity: Not on file   Other Topics Concern  . Not on file   Social History Narrative  . No narrative on file    REVIEW OF SYSTEMS: Constitutional: No fevers, chills, or sweats, no generalized fatigue, change in appetite Eyes: No visual changes, double vision, eye pain Ear, nose and throat: No hearing loss, ear pain, nasal congestion, sore throat Cardiovascular: No chest pain, palpitations Respiratory:  No shortness of breath at rest or with exertion, wheezes GastrointestinaI: No nausea, vomiting, diarrhea, abdominal pain, fecal incontinence Genitourinary:  No dysuria, urinary retention or frequency Musculoskeletal:  No neck pain, back pain Integumentary: No rash, pruritus, skin lesions Neurological: as above Psychiatric: No depression, insomnia, anxiety Endocrine: No palpitations, fatigue, diaphoresis, mood swings, change in appetite, change in weight, increased thirst Hematologic/Lymphatic:  No anemia, purpura, petechiae. Allergic/Immunologic: no itchy/runny eyes, nasal congestion, recent allergic reactions, rashes  PHYSICAL EXAM: Vitals:   02/21/16 1600  BP: 118/74  Pulse: 97   General: No acute distress Head:  Normocephalic/atraumatic Neck:  supple, no paraspinal tenderness, full range of motion Heart:  Regular rate and rhythm Lungs:  Clear to auscultation bilaterally Back: No paraspinal tenderness Skin/Extremities: No rash, no edema Neurological Exam: alert and oriented to person, place, and time. No aphasia or dysarthria. Fund of knowledge is appropriate.  Recent and remote memory are intact. 3/3 delayed recall.  Attention and concentration are normal.    Able to name objects and repeat phrases. Cranial nerves: Pupils equal, round, reactive to light.  Extraocular movements intact with no nystagmus. Visual fields full. Facial sensation intact. No facial asymmetry. Tongue, uvula, palate midline.  Motor: Bulk and tone normal, muscle strength 5/5 throughout with no pronator drift.  Sensation to light touch intact.  No extinction to double simultaneous stimulation.  Deep tendon reflexes 2+ throughout, toes downgoing.  Finger to nose testing intact.  Gait narrow-based and steady, able to tandem walk adequately.  Romberg negative.  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 Routine EEG in 2014 had shown left temporal slowing, MRI brain normal. Last seizure on 04/02/14 triggered by sleep deprivation. She is now on Lamictal 100mg  BID with no seizures since November 2015, no side effects.  Continue current dose, refills sent today. No current pregnancy plans, she is taking daily folic acid. She is aware of [place] driving laws to stop driving until 6 months seizure-free. She will follow-up in 1 year and knows to call our office for any changes.   Thank you for allowing me to participate in her care.  Please do not hesitate to call for any questions or concerns.  The duration of this appointment visit was 15 minutes of face-to-face time with the patient.  Greater than 50% of this time was spent in counseling,  explanation of diagnosis, planning of further management, and coordination of care.   Ellouise Newer, M.D.   CC: Dr. Justin Mend

## 2016-05-30 ENCOUNTER — Emergency Department (HOSPITAL_COMMUNITY)
Admission: EM | Admit: 2016-05-30 | Discharge: 2016-05-30 | Disposition: A | Discharge status: Home / Self Care | Payer: 59 | Attending: Emergency Medicine | Admitting: Emergency Medicine

## 2016-05-30 DIAGNOSIS — G40909 Epilepsy, unspecified, not intractable, without status epilepticus: Secondary | ICD-10-CM | POA: Insufficient documentation

## 2016-05-30 DIAGNOSIS — R569 Unspecified convulsions: Secondary | ICD-10-CM

## 2016-05-30 DIAGNOSIS — Z79899 Other long term (current) drug therapy: Secondary | ICD-10-CM | POA: Diagnosis not present

## 2016-05-30 LAB — COMPREHENSIVE METABOLIC PANEL
ALT: 15 U/L (ref 14–54)
AST: 19 U/L (ref 15–41)
Albumin: 4.2 g/dL (ref 3.5–5.0)
Alkaline Phosphatase: 97 U/L (ref 38–126)
Anion gap: 6 (ref 5–15)
BUN: 10 mg/dL (ref 6–20)
CALCIUM: 9 mg/dL (ref 8.9–10.3)
CO2: 29 mmol/L (ref 22–32)
Chloride: 103 mmol/L (ref 101–111)
Creatinine, Ser: 0.92 mg/dL (ref 0.44–1.00)
GFR calc Af Amer: >60 mL/min (ref >60)
GFR calc non Af Amer: >60 mL/min (ref >60)
GLUCOSE: 118 mg/dL — AB (ref 65–99)
Potassium: 3.7 mmol/L (ref 3.5–5.1)
Sodium: 138 mmol/L (ref 135–145)
Total Bilirubin: 0.5 mg/dL (ref 0.3–1.2)
Total Protein: 7.1 g/dL (ref 6.5–8.1)

## 2016-05-30 LAB — CBC WITH DIFFERENTIAL/PLATELET
BASOS ABS: 0.1 10*3/uL (ref 0.0–0.1)
BASOS PCT: 1 %
EOS ABS: 0.2 10*3/uL (ref 0.0–0.7)
Eosinophils Relative: 2 %
HCT: 45.2 % (ref 36.0–46.0)
HEMOGLOBIN: 14.6 g/dL (ref 12.0–15.0)
LYMPHS PCT: 24 %
Lymphs Abs: 2.1 10*3/uL (ref 0.7–4.0)
MCH: 27 pg (ref 26.0–34.0)
MCHC: 32.3 g/dL (ref 30.0–36.0)
MCV: 83.7 fL (ref 78.0–100.0)
Monocytes Absolute: 0.7 10*3/uL (ref 0.1–1.0)
Monocytes Relative: 8 %
NEUTROS ABS: 5.8 10*3/uL (ref 1.7–7.7)
Neutrophils Relative %: 65 %
Platelets: 341 10*3/uL (ref 150–400)
RBC: 5.4 MIL/uL — ABNORMAL HIGH (ref 3.87–5.11)
RDW: 13.8 % (ref 11.5–15.5)
WBC: 8.9 10*3/uL (ref 4.0–10.5)

## 2016-05-30 LAB — I-STAT BETA HCG BLOOD, ED (MC, WL, AP ONLY)

## 2016-05-30 LAB — CBG MONITORING, ED: Glucose-Capillary: 108 mg/dL — ABNORMAL HIGH (ref 65–99)

## 2016-05-30 NOTE — ED Provider Notes (Signed)
[facility] DEPT Provider Note   CSN: [id number] Arrival date & time: 05/30/16  1008     History   Chief Complaint Chief Complaint  Patient presents with  . Seizures    HPI Anne Benjamin is a 36 y.o. female.  Pt presents to the ED today with a possible seizure.  She was found on the floor of the bathroom at her job somnolent.  No one witnessed a seizure, but pt does have a hx of seizure d/o.  She does take Lamictal and is followed by Dr. Delice Lesch (neurology).  No recent dosage or medicine changes.  EMS said pt was mildly postictal when they arrived, but is waking up more now.  Pt has not had a seizure since 2015.  She has not been sleeping well because she is on her period and it's been a painful one, so she's been tossing and turning.      Past Medical History:  Diagnosis Date  . Convulsions/seizures ([place]) 09/03/2012  . Obesity   . Seizures [facility])     Patient Active Problem List   Diagnosis Date Noted  . Localization-related idiopathic epilepsy and epileptic syndromes with seizures of localized onset, not intractable, without status epilepticus ([place]) 03/03/2014  . Fever 02/24/2014  . Seizure ([place]) 02/24/2014  . Convulsions/seizures ([place]) 09/03/2012    Past Surgical History:  Procedure Laterality Date  . CESAREAN SECTION     x 1    OB History    No data available       Home Medications    Prior to Admission medications   Medication Sig Start Date End Date Taking? Authorizing Provider  lamoTRIgine (LAMICTAL) 100 MG tablet Take 1 tablet (100 mg total) by mouth 2 (two) times daily. 02/21/16  Yes [facility] Sprang, MD  Multiple Vitamin (MULTIVITAMIN) tablet Take 1 tablet by mouth daily.   Yes Historical Provider, MD    Family History Family History  Problem Relation Age of Onset  . Hypertension Mother     Social History Social History  Substance Use Topics  . Smoking status: Never Smoker  . Smokeless tobacco: Not on file  . Alcohol use Yes     Comment:  occasional     Allergies   Orange oil and Penicillins   Review of Systems Review of Systems  Neurological: Positive for seizures.  All other systems reviewed and are negative.    Physical Exam Updated Vital Signs BP 131/96 (BP Location: Left Arm)   Pulse 91   Temp 98.1 F (36.7 C) (Oral)   Resp 12   SpO2 99%   Physical Exam  Constitutional: She is oriented to person, place, and time. She appears well-developed and well-nourished.  HENT:  Head: Normocephalic and atraumatic.  Right Ear: External ear normal.  Left Ear: External ear normal.  Nose: Nose normal.  Mouth/Throat: Oropharynx is clear and moist.  Eyes: Conjunctivae and EOM are normal. Pupils are equal, round, and reactive to light.  Neck: Normal range of motion. Neck supple.  Cardiovascular: Normal rate, regular rhythm, normal heart sounds and intact distal pulses.   Pulmonary/Chest: Effort normal and breath sounds normal.  Abdominal: Soft. Bowel sounds are normal.  Musculoskeletal: Normal range of motion.  Neurological: She is alert and oriented to person, place, and time.  Skin: Skin is warm.  Psychiatric: She has a normal mood and affect. Her behavior is normal. Judgment and thought content normal.  Nursing note and vitals reviewed.    ED Treatments / Results  Labs (all labs ordered are listed, but only abnormal results are displayed) Labs Reviewed  COMPREHENSIVE METABOLIC PANEL - Abnormal; Notable for the following:       Result Value   Glucose, Bld 118 (*)    All other components within normal limits  CBC WITH DIFFERENTIAL/PLATELET - Abnormal; Notable for the following:    RBC 5.40 (*)    All other components within normal limits  CBG MONITORING, ED - Abnormal; Notable for the following:    Glucose-Capillary 108 (*)    All other components within normal limits  RAPID URINE DRUG SCREEN, HOSP PERFORMED  URINALYSIS, ROUTINE W REFLEX MICROSCOPIC  I-STAT BETA HCG BLOOD, ED ([facility], [facility], [facility] ONLY)     EKG  EKG Interpretation  Date/Time:  Thursday May 30 2016 10:17:54 EST Ventricular Rate:  93 PR Interval:    QRS Duration: 87 QT Interval:  352 QTC Calculation: 438 R Axis:   77 Text Interpretation:  Sinus rhythm Low voltage, precordial leads Confirmed by Graviela Nodal MD, Tanor Glaspy ([id number]) on 05/30/2016 10:53:27 AM Also confirmed by [facility] MD, Yohanna Tow ([id number]), editor [place]  CCT, [place] ([id number])  on 05/30/2016 10:59:34 AM       Radiology No results found.  Procedures Procedures (including critical care time)  Medications Ordered in ED Medications - No data to display   Initial Impression / Assessment and Plan / ED Course  I have reviewed the triage vital signs and the nursing notes.  Pertinent labs & imaging results that were available during my care of the patient were reviewed by me and considered in my medical decision making (see chart for details).     Pt has remained stable while here.  No additional seizures.  I suspect strongly that is what happened today.  Pt's urine was grossly bloody and she preferred not to be cathed if not necessary.  The pt has no urine sx.  Seizure likely occurred due to lack of sleep.  Pt is instructed to f/u with her neurologist.  She knows to return if worse.  Final Clinical Impressions(s) / ED Diagnoses   Final diagnoses:  Seizure [facility])    New Prescriptions New Prescriptions   No medications on file     Isla Pence, MD 05/30/16 1318

## 2016-05-30 NOTE — ED Triage Notes (Signed)
BIB EMS from Work, pt experienced possible unwitnessed seizure activity. No oral trauma noted. Pt denies neck pain and or head pain. Pt A+OX4 on the scene.   EMS Vitals BP 132/77 P 88 RR 16 SPO2 96%  CBG 115

## 2016-05-30 NOTE — ED Notes (Signed)
Bed: WA01 Expected date:  Expected time:  Means of arrival:  Comments: EMS- 36yo F, syncope/seizure?

## 2016-05-30 NOTE — ED Notes (Signed)
Patient attempted urine sample and was unsuccessful.

## 2016-06-03 ENCOUNTER — Ambulatory Visit (INDEPENDENT_AMBULATORY_CARE_PROVIDER_SITE_OTHER): Payer: 59 | Admitting: Neurology

## 2016-06-03 ENCOUNTER — Encounter: Payer: Self-pay | Admitting: Neurology

## 2016-06-03 VITALS — BP 122/84 | HR 81 | Ht <= 58 in | Wt 205.2 lb

## 2016-06-03 DIAGNOSIS — G40009 Localization-related (focal) (partial) idiopathic epilepsy and epileptic syndromes with seizures of localized onset, not intractable, without status epilepticus: Secondary | ICD-10-CM

## 2016-06-03 MED ORDER — LAMOTRIGINE 100 MG PO TABS: ORAL_TABLET | ORAL | 3 refills | Status: DC

## 2016-06-03 NOTE — Patient Instructions (Signed)
1. Increase Lamotrigine 100mg : Take 1 tablet in AM, 1.5 tablets in PM 2. Continue daily folic acid 1mg  3. Discuss menstrual cramps with your gynecologist 4. Follow-up in 6 months  Seizure Precautions: 1. If medication has been prescribed for you to prevent seizures, take it exactly as directed.  Do not stop taking the medicine without talking to your doctor first, even if you have not had a seizure in a long time.   2. Avoid activities in which a seizure would cause danger to yourself or to others.  Don't operate dangerous machinery, swim alone, or climb in high or dangerous places, such as on ladders, roofs, or girders.  Do not drive unless your doctor says you may.  3. If you have any warning that you may have a seizure, lay down in a safe place where you can't hurt yourself.    4.  No driving for 6 months from last seizure, as per [facility].   Please refer to the following link on the [place] website for more information: http://www.epilepsyfoundation.org/answerplace/Social/driving/drivingu.cfm   5.  Maintain good sleep hygiene. Avoid alcohol.  6.  Notify your neurology if you are planning pregnancy or if you become pregnant.  7.  Contact your doctor if you have any problems that may be related to the medicine you are taking.  8.  Call 911 and bring the patient back to the ED if:        A.  The seizure lasts longer than 5 minutes.       B.  The patient doesn't awaken shortly after the seizure  C.  The patient has new problems such as difficulty seeing, speaking or moving  D.  The patient was injured during the seizure  E.  The patient has a temperature over 102 F (39C)  F.  The patient vomited and now is having trouble breathing

## 2016-06-03 NOTE — Progress Notes (Signed)
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 She presents for an earlier visit due to a breakthrough seizure last 05/30/16. She had been doing well with no seizures since November 2015 on Lamotrigine 100mg  BID with no side effects. She was at work then recalls feeling extremely nauseated. She had a bad night of sleep the night prior due to significant menstrual cramps, which is unusual for her. She denied any infection, no missed medication. She denies any staring/unresponsive episodes, no gaps in time, headaches, dizziness, diplopia, gait instability. No olfactory/gustatory hallucinations, focal numbness/tingling/weakness, myoclonic jerks. No current pregnancy plans, she takes folic acid daily.  HPI: This is a pleasant 36 yo RH woman with seizures since 09/01/2012. She was at home when her husband heard her fall on the floor and found her shaking with all extremities extended. She had told him she felt tired that day after being up for 72 hours studying and finishing a paper for school. She woke up with EMS around her, feeling tired, no focal weakness. Per EMS report, she had a post-ictal phase of approximately 30 minutes. CBC showed a WBC of 10.7, BMP unremarkable. She had seen neurologist Dr. Jannifer Franklin and routine EEG done showed intermittent dysrhythmic theta slowing from the left mid-temporal region, with occasional sharp transients. At no time did there appear to be evidence of spike or spike wave discharges. She was unable to do MRI brain ordered. She was started on Keppra 500mg  BID, however she felt very sleepy and unable to function on this, and had self-reduced dose to 250mg  daily. She had  been on this dose and had another seizure that occurred out of sleep in June/July 2015. Her husband heard her shaking, with urinary incontinence and tongue bite. She may have been sleep-deprived that time. She did not seek medical attention. On 02/24/14, she was asleep at 4am when her husband woke up to her having a convulsion that lasted 30 seconds. She went back to sleep. He came back in the room at 10am and she recalls asking him if she had another seizure, he reported that she looked like she had another one and there was a wet spot on her pajamas. Around noon, he heard a noise but thought it was a dog outside. Then at 3:30pm, he was in the room and again heard the same noise and found her having another convulsion. She was brought to [facility] where she was noted to be febrile with rectal temperature of 101.3. Her WBC was 17.2. She had a head CT which I personally reviewed which was normal. She had a lumbar puncture which was traumatic with 700 RBC pink hazy CSF, 2 WBC, protein 13, glucose 85, gram stain and culture, VDRL, HSV, and Cryptococcal Ab negative. She was started on Dilantin, last level 8.0, discharged home on Dilantin 100mg  in AM, 150mg  at 3pm, 100mg  qhs which also caused drowsiness. She was started on Lamictal in October 2015.   Her husband had noticed infrequent blanking out episodes lasting 10 seconds a year ago, none recently. She denies any gaps in time, no olfactory/gustatory hallucinations, deja vu, rising epigastric sensation, focal numbness/tingling/weakness, myoclonic jerks.   Epilepsy Risk Factors: Her paternal half-sister has seizures. She had  2 concussions in her 44s, one with loss of consciousness. Otherwise she had a normal birth and early development. There is no history of febrile convulsions, CNS infections such as meningitis/encephalitis, significant traumatic brain injury, neurosurgical procedures.  Prior AEDs: Keppra, Dilantin  Lamictal level 07/19/14: 5.3.  PAST MEDICAL  HISTORY: Past Medical History:  Diagnosis Date  . Convulsions/seizures ([place]) 09/03/2012  . Obesity   . Seizures ([place])     MEDICATIONS: Current Outpatient Prescriptions on File Prior to Visit  Medication Sig Dispense Refill  . lamoTRIgine (LAMICTAL) 100 MG tablet Take 1 tablet (100 mg total) by mouth 2 (two) times daily. 180 tablet 3  . Multiple Vitamin (MULTIVITAMIN) tablet Take 1 tablet by mouth daily.     No current facility-administered medications on file prior to visit.     ALLERGIES: Allergies  Allergen Reactions  . Orange Oil Swelling  . Penicillins Nausea And Vomiting    Has patient had a PCN reaction causing immediate rash, facial/tongue/throat swelling, SOB or lightheadedness with hypotension: yes Has patient had a PCN reaction causing severe rash involving mucus membranes or skin necrosis: no Has patient had a PCN reaction that required hospitalization : unknown Has patient had a PCN reaction occurring within the last 10 years: no If all of the above answers are "NO", then may proceed with Cephalosporin use.     FAMILY HISTORY: Family History  Problem Relation Age of Onset  . Hypertension Mother     SOCIAL HISTORY: Social History   Social History  . Marital status: Married    Spouse name: N/A  . Number of children: 1  . Years of education: S-College   Occupational History  . Not on file.   Social History Main Topics  . Smoking status: Never Smoker  . Smokeless tobacco: Never Used  . Alcohol use Yes     Comment: occasional  . Drug use: No  . Sexual activity: Not on file   Other Topics Concern  . Not on file   Social History Narrative  . No narrative on file    REVIEW OF SYSTEMS: Constitutional: No fevers, chills, or sweats, no generalized fatigue, change in appetite Eyes: No visual changes, double vision, eye pain Ear, nose and throat: No hearing loss, ear pain, nasal congestion, sore throat Cardiovascular: No chest pain,  palpitations Respiratory:  No shortness of breath at rest or with exertion, wheezes GastrointestinaI: No nausea, vomiting, diarrhea, abdominal pain, fecal incontinence Genitourinary:  No dysuria, urinary retention or frequency Musculoskeletal:  No neck pain, back pain Integumentary: No rash, pruritus, skin lesions Neurological: as above Psychiatric: No depression, insomnia, anxiety Endocrine: No palpitations, fatigue, diaphoresis, mood swings, change in appetite, change in weight, increased thirst Hematologic/Lymphatic:  No anemia, purpura, petechiae. Allergic/Immunologic: no itchy/runny eyes, nasal congestion, recent allergic reactions, rashes  PHYSICAL EXAM: Vitals:   06/03/16 1532  BP: 122/84  Pulse: 81   General: No acute distress Head:  Normocephalic/atraumatic Neck: supple, no paraspinal tenderness, full range of motion Heart:  Regular rate and rhythm Lungs:  Clear to auscultation bilaterally Back: No paraspinal tenderness Skin/Extremities: No rash, no edema Neurological Exam: alert and oriented to person, place, and time. No aphasia or dysarthria. Fund of knowledge is appropriate.  Recent and remote memory are intact. Attention and concentration are normal.    Able to name objects and repeat phrases. Cranial nerves: Pupils equal, round, reactive to light.  Extraocular movements intact with no nystagmus. Visual fields full. Facial sensation intact. No facial asymmetry. Tongue, uvula,  palate midline.  Motor: Bulk and tone normal, muscle strength 5/5 throughout with no pronator drift.  Sensation to light touch intact.  No extinction to double simultaneous stimulation.  Deep tendon reflexes 2+ throughout, toes downgoing.  Finger to nose testing intact.  Gait narrow-based and steady, able to tandem walk adequately.  Romberg negative.  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 Routine EEG in 2014 had shown left temporal slowing, MRI brain normal. She was doing well, seizure-free for more than 2 years, when she had a breakthrough seizure last 05/30/16 that appears to have been provoked by sleep deprivation, also possibly her menstrual period (she had bad cramps). We agreed to increase Lamotrigine, she wants to do it slowly, and increase to 100mg  in AM, 150mg  in PM. No current pregnancy plans, she is taking daily folic acid. She will discuss the change in menstrual cramps with her gynecologist. She is aware of [place] driving laws to stop driving until 6 months seizure-free. She will follow-up in 6 months and knows to call our office for any changes.   Thank you for allowing me to participate in her care.  Please do not hesitate to call for any questions or concerns.  The duration of this appointment visit was 25 minutes of face-to-face time with the patient.  Greater than 50% of this time was spent in counseling, explanation of diagnosis, planning of further management, and coordination of care.   Ellouise Newer, M.D.   CC: Dr. Justin Mend

## 2016-10-26 ENCOUNTER — Encounter (HOSPITAL_COMMUNITY): Payer: Self-pay | Admitting: Emergency Medicine

## 2016-10-26 ENCOUNTER — Emergency Department (HOSPITAL_COMMUNITY)
Admission: EM | Admit: 2016-10-26 | Discharge: 2016-10-26 | Disposition: A | Discharge status: Home / Self Care | Payer: 59 | Attending: Emergency Medicine | Admitting: Emergency Medicine

## 2016-10-26 DIAGNOSIS — R569 Unspecified convulsions: Secondary | ICD-10-CM | POA: Insufficient documentation

## 2016-10-26 LAB — I-STAT BETA HCG BLOOD, ED (MC, WL, AP ONLY): I-stat hCG, quantitative: <5 m[IU]/mL (ref <5)

## 2016-10-26 LAB — BASIC METABOLIC PANEL
ANION GAP: 6 (ref 5–15)
BUN: 10 mg/dL (ref 6–20)
CALCIUM: 9 mg/dL (ref 8.9–10.3)
CO2: 26 mmol/L (ref 22–32)
Chloride: 105 mmol/L (ref 101–111)
Creatinine, Ser: 0.84 mg/dL (ref 0.44–1.00)
GLUCOSE: 116 mg/dL — AB (ref 65–99)
POTASSIUM: 4.5 mmol/L (ref 3.5–5.1)
Sodium: 137 mmol/L (ref 135–145)

## 2016-10-26 LAB — CBG MONITORING, ED
GLUCOSE-CAPILLARY: 110 mg/dL — AB (ref 65–99)
GLUCOSE-CAPILLARY: 113 mg/dL — AB (ref 65–99)

## 2016-10-26 NOTE — ED Provider Notes (Signed)
[facility] DEPT Provider Note   CSN: [id number] Arrival date & time: 10/26/16  1039     History   Chief Complaint Chief Complaint  Patient presents with  . Seizures    HPI Anne Benjamin is a 36 y.o. female.  HPI Patient presents to the emergency room after a witnessed seizure. Patient has a history of seizure disorder. She does not have frequent seizures but last had one in January 2018.  Patient is followed by Dr. Delice Lesch.  Patient is currently taking Lamictal  the patient was working out today in a gym. It was very hot in the gym. Patient denies having any trouble with sleep deprivation. She denies any recent illnesses. No fevers or cough. No infections.  Pt was at the gym when she had a witnessed fall onto a padded floor.  She started having generalized seizure activity.  This was witnessed by PA Harris (ED PA) who happened to be working out at the gym with her.  Pt had a postictal phase.  Now in the ED she is back to her baseline.  She denies any complaints currently. She denies missing any of her medications. Past Medical History:  Diagnosis Date  . Convulsions/seizures ([place]) 09/03/2012  . Obesity   . Seizures [facility])     Patient Active Problem List   Diagnosis Date Noted  . Localization-related idiopathic epilepsy and epileptic syndromes with seizures of localized onset, not intractable, without status epilepticus ([place]) 03/03/2014  . Fever 02/24/2014  . Seizure ([place]) 02/24/2014  . Convulsions/seizures ([place]) 09/03/2012    Past Surgical History:  Procedure Laterality Date  . CESAREAN SECTION     x 1    OB History    No data available       Home Medications    Prior to Admission medications   Medication Sig Start Date End Date Taking? Authorizing Provider  lamoTRIgine (LAMICTAL) 100 MG tablet Take 1 tablet in AM, 1.5 tablets in PM Patient taking differently: 100-150 mg. Take 1 tablet in AM, 1.5 tablets in PM 06/03/16  Yes Cameron Sprang, MD  Multiple Vitamin  (MULTIVITAMIN) tablet Take 1 tablet by mouth daily.   Yes [provider]    Family History Family History  Problem Relation Age of Onset  . Hypertension Mother     Social History Social History  Substance Use Topics  . Smoking status: Never Smoker  . Smokeless tobacco: Never Used  . Alcohol use Yes     Comment: occasional     Allergies   Orange oil and Penicillins   Review of Systems Review of Systems  All other systems reviewed and are negative.    Physical Exam Updated Vital Signs BP 112/78 (BP Location: Left Arm)   Pulse 96   Temp 98.5 F (36.9 C) (Oral)   Resp 18   SpO2 100%   Physical Exam  Constitutional: She appears well-developed and well-nourished. No distress.  HENT:  Head: Normocephalic and atraumatic.  Right Ear: External ear normal.  Left Ear: External ear normal.  Eyes: Conjunctivae are normal. Right eye exhibits no discharge. Left eye exhibits no discharge. No scleral icterus.  Neck: Neck supple. No tracheal deviation present.  Cardiovascular: Normal rate, regular rhythm and intact distal pulses.   Pulmonary/Chest: Effort normal and breath sounds normal. No stridor. No respiratory distress. She has no wheezes. She has no rales.  Abdominal: Soft. Bowel sounds are normal. She exhibits no distension. There is no tenderness. There is no rebound and no guarding.  Musculoskeletal: She exhibits no edema or tenderness.  Neurological: She is alert. She has normal strength. She is not disoriented. No cranial nerve deficit (no facial droop, extraocular movements intact, no slurred speech) or sensory deficit. She exhibits normal muscle tone. She displays no seizure activity. Coordination normal.  Skin: Skin is warm and dry. No rash noted.  Psychiatric: She has a normal mood and affect.  Nursing note and vitals reviewed.    ED Treatments / Results  Labs (all labs ordered are listed, but only abnormal results are displayed) Labs Reviewed  BASIC  METABOLIC PANEL - Abnormal; Notable for the following:       Result Value   Glucose, Bld 116 (*)    All other components within normal limits  CBG MONITORING, ED - Abnormal; Notable for the following:    Glucose-Capillary 110 (*)    All other components within normal limits  CBG MONITORING, ED - Abnormal; Notable for the following:    Glucose-Capillary 113 (*)    All other components within normal limits  I-STAT CHEM 8, ED  I-STAT BETA HCG BLOOD, ED ([facility], [facility], [facility] ONLY)     Procedures Procedures (including critical care time)  Medications Ordered in ED Medications - No data to display   Initial Impression / Assessment and Plan / ED Course  I have reviewed the triage vital signs and the nursing notes.  Pertinent labs & imaging results that were available during my care of the patient were reviewed by me and considered in my medical decision making (see chart for details).  Clinical Course as of Oct 26 1400  Sat Oct 26, 2016  1207 Notifed that potassium is greater than 8.5  Cr is normal.  Likely hemolyzed.  Will redraw  [JK]  1401 Repeat BMET is normal.  Hemolysis on the previous istat  [JK]    Clinical Course User Index [JK] Dorie Rank, MD    Pt was monitored in the ED.  No further seizure activity.  May have been triggered by the heat and her activity.  Continue current medications. Safe to follow up with her neurologist.    Final Clinical Impressions(s) / ED Diagnoses   Final diagnoses:  Seizure [facility])    New Prescriptions New Prescriptions   No medications on file     Dorie Rank, MD 10/26/16 1402

## 2016-10-26 NOTE — ED Triage Notes (Signed)
Pt from gym via EMS after having a witnessed seizure. Pt fell to a padded floor and was observed to have a seizure. Pt suffered no injury from fall. Per EMS, pt was post-ictal upon arrival. Pt has hx of seizures. Pt arrives A&O and in NAD

## 2016-10-26 NOTE — ED Notes (Signed)
RN AND MD NOTIFIED OF PATIENT'S CHEM-8 ( >8.5)

## 2016-10-26 NOTE — ED Notes (Signed)
Bed: WA20 Expected date:  Expected time:  Means of arrival:  Comments: 36 yo seizures

## 2016-10-26 NOTE — Discharge Instructions (Signed)
Continue your current medications, follow up with your neurologist, return for recurrent symptoms

## 2016-11-27 ENCOUNTER — Telehealth: Payer: Self-pay | Admitting: Neurology

## 2016-11-27 ENCOUNTER — Other Ambulatory Visit: Payer: Self-pay

## 2016-11-27 DIAGNOSIS — G40009 Localization-related (focal) (partial) idiopathic epilepsy and epileptic syndromes with seizures of localized onset, not intractable, without status epilepticus: Secondary | ICD-10-CM

## 2016-11-27 MED ORDER — LAMOTRIGINE 100 MG PO TABS: 100.0000 mg | ORAL_TABLET | Freq: Every day | ORAL | 3 refills | Status: DC

## 2016-11-27 NOTE — Telephone Encounter (Signed)
Patient was told by the pharmacy to call to have the refill sent in for Lamotrigine. She uses [facility] on [address]. Please call. Thanks

## 2016-11-27 NOTE — Telephone Encounter (Signed)
Rx sent to pt's listed preferred pharmacy.  

## 2016-12-03 ENCOUNTER — Encounter: Payer: Self-pay | Admitting: Neurology

## 2016-12-03 ENCOUNTER — Ambulatory Visit (INDEPENDENT_AMBULATORY_CARE_PROVIDER_SITE_OTHER): Payer: 59 | Admitting: Neurology

## 2016-12-03 VITALS — BP 114/82 | HR 64 | Resp 16 | Ht <= 58 in | Wt 210.0 lb

## 2016-12-03 DIAGNOSIS — G40009 Localization-related (focal) (partial) idiopathic epilepsy and epileptic syndromes with seizures of localized onset, not intractable, without status epilepticus: Secondary | ICD-10-CM

## 2016-12-03 MED ORDER — LAMOTRIGINE 100 MG PO TABS: ORAL_TABLET | ORAL | 11 refills | Status: DC

## 2016-12-03 NOTE — Progress Notes (Signed)
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 Since her last visit, she was in the ER on 10/26/16 for a seizure while in the gym. Prior to this, her last seizure was in January 2018, Lamotrigine dose increased to 100mg  in AM, 150mg  in PM at that time. She was doing circuit training and recalls feeling tired and lying on the ground, then was witnessed to have a GTC followed by post-ictal confusion. She was back to baseline in the ER. She reports being sleep-deprived that day, and she was again on her menstrual period, similar to her seizure in January. She denies any staring/unresponsive episodes, no gaps in time, headaches, dizziness, diplopia, gait instability. No olfactory/gustatory hallucinations, focal numbness/tingling/weakness, myoclonic jerks. She and her husband are thinking of planning for pregnancy next year.  HPI: This is a pleasant 36 yo RH woman with seizures since 09/01/2012. She was at home when her husband heard her fall on the floor and found her shaking with all extremities extended. She had told him she felt tired that day after being up for 72 hours studying and finishing a paper for school. She woke up with EMS around her, feeling tired, no focal weakness. Per EMS report, she had a post-ictal phase of approximately 30 minutes. CBC showed a WBC of 10.7, BMP unremarkable. She had seen neurologist Dr. Jannifer Franklin and routine EEG done showed intermittent dysrhythmic theta slowing from the left mid-temporal region, with occasional sharp transients. At no time did there appear to be evidence of spike or spike wave discharges. She was unable to do MRI brain ordered. She was started on Keppra  500mg  BID, however she felt very sleepy and unable to function on this, and had self-reduced dose to 250mg  daily. She had been on this dose and had another seizure that occurred out of sleep in June/July 2015. Her husband heard her shaking, with urinary incontinence and tongue bite. She may have been sleep-deprived that time. She did not seek medical attention. On 02/24/14, she was asleep at 4am when her husband woke up to her having a convulsion that lasted 30 seconds. She went back to sleep. He came back in the room at 10am and she recalls asking him if she had another seizure, he reported that she looked like she had another one and there was a wet spot on her pajamas. Around noon, he heard a noise but thought it was a dog outside. Then at 3:30pm, he was in the room and again heard the same noise and found her having another convulsion. She was brought to [facility] where she was noted to be febrile with rectal temperature of 101.3. Her WBC was 17.2. She had a head CT which I personally reviewed which was normal. She had a lumbar puncture which was traumatic with 700 RBC pink hazy CSF, 2 WBC, protein 13, glucose 85, gram stain and culture, VDRL, HSV, and Cryptococcal Ab negative. She was started on Dilantin, last level 8.0, discharged home on Dilantin 100mg  in AM, 150mg  at 3pm, 100mg  qhs which also caused drowsiness. She was started on Lamictal in October 2015.   She had a breakthrough seizure last 05/30/16 after being seizure-free for 2 years on Lamotrigine 100mg  BID  with no side effects. She was at work then recalls feeling extremely nauseated. She had a bad night of sleep the night prior due to significant menstrual cramps, which is unusual for her. She denied any infection, no missed medication.   Her husband had noticed infrequent blanking out episodes lasting 10 seconds a year ago, none recently. She denies any gaps in time, no olfactory/gustatory hallucinations, deja vu, rising epigastric sensation, focal  numbness/tingling/weakness, myoclonic jerks.   Epilepsy Risk Factors: Her paternal half-sister has seizures. She had 2 concussions in her 21s, one with loss of consciousness. Otherwise she had a normal birth and early development. There is no history of febrile convulsions, CNS infections such as meningitis/encephalitis, significant traumatic brain injury, neurosurgical procedures.  Prior AEDs: Keppra, Dilantin  Lamictal level 07/19/14: 5.3.  PAST MEDICAL HISTORY: Past Medical History:  Diagnosis Date  . Convulsions/seizures ([place]) 09/03/2012  . Obesity   . Seizures ([place])     MEDICATIONS: Current Outpatient Prescriptions on File Prior to Visit  Medication Sig Dispense Refill  . lamoTRIgine (LAMICTAL) 100 MG tablet Take 1-1.5 tablets (100-150 mg total) by mouth daily. Take 1 tablet in AM, 1.5 tablets in PM 225 tablet 3  . Multiple Vitamin (MULTIVITAMIN) tablet Take 1 tablet by mouth daily.     No current facility-administered medications on file prior to visit.     ALLERGIES: Allergies  Allergen Reactions  . Orange Oil Swelling  . Penicillins Nausea And Vomiting    Has patient had a PCN reaction causing immediate rash, facial/tongue/throat swelling, SOB or lightheadedness with hypotension: yes Has patient had a PCN reaction causing severe rash involving mucus membranes or skin necrosis: no Has patient had a PCN reaction that required hospitalization : unknown Has patient had a PCN reaction occurring within the last 10 years: no If all of the above answers are "NO", then may proceed with Cephalosporin use.     FAMILY HISTORY: Family History  Problem Relation Age of Onset  . Hypertension Mother     SOCIAL HISTORY: Social History   Social History  . Marital status: Married    Spouse name: N/A  . Number of children: 1  . Years of education: S-College   Occupational History  . Not on file.   Social History Main Topics  . Smoking status: Never Smoker  . Smokeless  tobacco: Never Used  . Alcohol use Yes     Comment: occasional  . Drug use: No  . Sexual activity: Not on file   Other Topics Concern  . Not on file   Social History Narrative  . No narrative on file    REVIEW OF SYSTEMS: Constitutional: No fevers, chills, or sweats, no generalized fatigue, change in appetite Eyes: No visual changes, double vision, eye pain Ear, nose and throat: No hearing loss, ear pain, nasal congestion, sore throat Cardiovascular: No chest pain, palpitations Respiratory:  No shortness of breath at rest or with exertion, wheezes GastrointestinaI: No nausea, vomiting, diarrhea, abdominal pain, fecal incontinence Genitourinary:  No dysuria, urinary retention or frequency Musculoskeletal:  No neck pain, back pain Integumentary: No rash, pruritus, skin lesions Neurological: as above Psychiatric: No depression, insomnia, anxiety Endocrine: No palpitations, fatigue, diaphoresis, mood swings, change in appetite, change in weight, increased thirst Hematologic/Lymphatic:  No anemia, purpura, petechiae. Allergic/Immunologic: no itchy/runny eyes, nasal congestion, recent allergic reactions, rashes  PHYSICAL EXAM: Vitals:   12/03/16 0832  BP: 114/82  Pulse: 64  Resp: 16   General: No acute distress Head:  Normocephalic/atraumatic  Neck: supple, no paraspinal tenderness, full range of motion Heart:  Regular rate and rhythm Lungs:  Clear to auscultation bilaterally Back: No paraspinal tenderness Skin/Extremities: No rash, no edema Neurological Exam: alert and oriented to person, place, and time. No aphasia or dysarthria. Fund of knowledge is appropriate.  Recent and remote memory are intact. Attention and concentration are normal.    Able to name objects and repeat phrases. Cranial nerves: Pupils equal, round, reactive to light.  Extraocular movements intact with no nystagmus. Visual fields full. Facial sensation intact. No facial asymmetry. Tongue, uvula, palate  midline.  Motor: Bulk and tone normal, muscle strength 5/5 throughout with no pronator drift.  Sensation to light touch intact.  No extinction to double simultaneous stimulation.  Deep tendon reflexes 2+ throughout, toes downgoing.  Finger to nose testing intact.  Gait narrow-based and steady, able to tandem walk adequately.  Romberg negative.  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 Routine EEG in 2014 had shown left temporal slowing, MRI brain normal. She had been seizure-free for 2 years, then had a seizure in January 2018 and most recently 10/26/16, both in the setting of sleep deprivation and menstrual period. She will increase Lamotrigine to 150mg  BID, check Lamotrigine level after dose increase. They are looking into pregnancy next year, we discussed issues in women with epilepsy, more than 90% have healthy pregnancies, low risks of fetal malformation on Lamotrigine, and the need to check Lamotrigine levels monthly when pregnant. She was advised to start taking prenatal vitamins and 77mcg folic acid daily starting now. We again discussed [place] driving laws to stop driving until 6 months seizure-free. She will follow-up in 6 months and knows to call our office for any changes.   Thank you for allowing me to participate in her care.  Please do not hesitate to call for any questions or concerns.  The duration of this appointment visit was 25 minutes of face-to-face time with the patient.  Greater than 50% of this time was spent in counseling, explanation of diagnosis, planning of further management, and coordination of care.   Ellouise Newer, M.D.   CC: Dr. Justin Mend

## 2016-12-03 NOTE — Patient Instructions (Addendum)
1. Check Lamictal level  Your provider has requested that you have labwork completed today. Please go to [facility] ([id number]) on the second floor of this building before leaving the office today. You do not need to check in. If you are not called within 15 minutes please check with the front desk.   2. Increase Lamictal 100mg : Take 1.5 tablets twice a day 3. Start a daily prenatal vitamin and folic acid 72mcg daily 4. Follow-up in 6 months, call for any changes  Seizure Precautions: 1. If medication has been prescribed for you to prevent seizures, take it exactly as directed.  Do not stop taking the medicine without talking to your doctor first, even if you have not had a seizure in a long time.   2. Avoid activities in which a seizure would cause danger to yourself or to others.  Don't operate dangerous machinery, swim alone, or climb in high or dangerous places, such as on ladders, roofs, or girders.  Do not drive unless your doctor says you may.  3. If you have any warning that you may have a seizure, lay down in a safe place where you can't hurt yourself.    4.  No driving for 6 months from last seizure, as per [facility].   Please refer to the following link on the [facility] website for more information: http://[email or web address]/[email or web address]/drivingu.cfm   5.  Maintain good sleep hygiene. Avoid alcohol.  6.  Notify your neurology if you are planning pregnancy or if you become pregnant.  7.  Contact your doctor if you have any problems that may be related to the medicine you are taking.  8.  Call 911 and bring the patient back to the ED if:        A.  The seizure lasts longer than 5 minutes.       B.  The patient doesn't awaken shortly after the seizure  C.  The patient has new problems such as difficulty seeing, speaking or moving  D.  The patient was injured during the seizure  E.  The patient has a temperature  over 102 F (39C)  F.  The patient vomited and now is having trouble breathing

## 2017-02-21 ENCOUNTER — Ambulatory Visit: Payer: 59 | Admitting: Neurology

## 2017-06-04 ENCOUNTER — Other Ambulatory Visit: Payer: Self-pay

## 2017-06-04 ENCOUNTER — Encounter: Payer: Self-pay | Admitting: Neurology

## 2017-06-04 ENCOUNTER — Ambulatory Visit: Payer: 59 | Admitting: Neurology

## 2017-06-04 VITALS — BP 126/84 | HR 92 | Resp 10 | Ht <= 58 in | Wt 209.0 lb

## 2017-06-04 DIAGNOSIS — G40009 Localization-related (focal) (partial) idiopathic epilepsy and epileptic syndromes with seizures of localized onset, not intractable, without status epilepticus: Secondary | ICD-10-CM | POA: Diagnosis not present

## 2017-06-04 NOTE — Patient Instructions (Signed)
1. Check Lamictal level. Depending on level, we will potentially increase Lamictal dose 2. Keep a calendar of your symptoms 3. Follow-up in 3-4 months, call for any changes  Seizure Precautions: 1. If medication has been prescribed for you to prevent seizures, take it exactly as directed.  Do not stop taking the medicine without talking to your doctor first, even if you have not had a seizure in a long time.   2. Avoid activities in which a seizure would cause danger to yourself or to others.  Don't operate dangerous machinery, swim alone, or climb in high or dangerous places, such as on ladders, roofs, or girders.  Do not drive unless your doctor says you may.  3. If you have any warning that you may have a seizure, lay down in a safe place where you can't hurt yourself.    4.  No driving for 6 months from last seizure, as per [facility].   Please refer to the following link on the [place] website for more information: http://[email or web address].org/answerplace/Social/driving/drivingu.cfm   5.  Maintain good sleep hygiene. Avoid alcohol.  6.  Notify your neurology if you are planning pregnancy or if you become pregnant.  7.  Contact your doctor if you have any problems that may be related to the medicine you are taking.  8.  Call 911 and bring the patient back to the ED if:        A.  The seizure lasts longer than 5 minutes.       B.  The patient doesn't awaken shortly after the seizure  C.  The patient has new problems such as difficulty seeing, speaking or moving  D.  The patient was injured during the seizure  E.  The patient has a temperature over 102 F (39C)  F.  The patient vomited and now is having trouble breathing

## 2017-06-04 NOTE — Progress Notes (Signed)
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 She had 2 convulsions in 2018, in January and June, each time Lamotrigine level was increased, currently at 150mg  BID without side effects. They deny any further convulsions since June 2018, but her husband reports an episode last November of staring/pausing with lip smacking, unresponsiveness for 10-15 seconds. She was really sleepy afterward. She denies missing medication, sleep deprivation, alcohol use, infection. She denies any gaps in time, headaches, dizziness, diplopia, gait instability. No olfactory/gustatory hallucinations, focal numbness/tingling/weakness, myoclonic jerks. She and her husband are thinking of planning for pregnancy in the next 5-6 months  HPI: This is a pleasant 37 yo RH woman with seizures since 09/01/2012. She was at home when her husband heard her fall on the floor and found her shaking with all extremities extended. She had told him she felt tired that day after being up for 72 hours studying and finishing a paper for school. She woke up with EMS around her, feeling tired, no focal weakness. Per EMS report, she had a post-ictal phase of approximately 30 minutes. CBC showed a WBC of 10.7, BMP unremarkable. She had seen neurologist Dr. Jannifer Franklin and routine EEG done showed intermittent dysrhythmic theta slowing from the left mid-temporal region, with occasional sharp transients. At no time did there appear to be evidence of spike or spike wave discharges. She was unable to do MRI brain ordered. She was started on Keppra 500mg  BID, however she felt very sleepy and unable to function on this, and had self-reduced  dose to 250mg  daily. She had been on this dose and had another seizure that occurred out of sleep in June/July 2015. Her husband heard her shaking, with urinary incontinence and tongue bite. She may have been sleep-deprived that time. She did not seek medical attention. On 02/24/14, she was asleep at 4am when her husband woke up to her having a convulsion that lasted 30 seconds. She went back to sleep. He came back in the room at 10am and she recalls asking him if she had another seizure, he reported that she looked like she had another one and there was a wet spot on her pajamas. Around noon, he heard a noise but thought it was a dog outside. Then at 3:30pm, he was in the room and again heard the same noise and found her having another convulsion. She was brought to [facility] where she was noted to be febrile with rectal temperature of 101.3. Her WBC was 17.2. She had a head CT which I personally reviewed which was normal. She had a lumbar puncture which was traumatic with 700 RBC pink hazy CSF, 2 WBC, protein 13, glucose 85, gram stain and culture, VDRL, HSV, and Cryptococcal Ab negative. She was started on Dilantin, last level 8.0, discharged home on Dilantin 100mg  in AM, 150mg  at 3pm, 100mg  qhs which also caused drowsiness. She was started on Lamictal in October 2015.   She had a breakthrough seizure last 05/30/16 after being seizure-free for 2 years on Lamotrigine 100mg  BID with no side effects. She was at work then recalls feeling extremely nauseated. She had a bad night of sleep the night prior due to significant menstrual cramps, which  is unusual for her. She denied any infection, no missed medication.   Her husband had noticed infrequent blanking out episodes lasting 10 seconds a year ago, none recently. She denies any gaps in time, no olfactory/gustatory hallucinations, deja vu, rising epigastric sensation, focal numbness/tingling/weakness, myoclonic jerks.   Epilepsy Risk Factors: Her paternal half-sister  has seizures. She had 2 concussions in her 22s, one with loss of consciousness. Otherwise she had a normal birth and early development. There is no history of febrile convulsions, CNS infections such as meningitis/encephalitis, significant traumatic brain injury, neurosurgical procedures.  Prior AEDs: Keppra, Dilantin  Lamictal level 07/19/14: 5.3.  PAST MEDICAL HISTORY: Past Medical History:  Diagnosis Date  . Convulsions/seizures ([place]) 09/03/2012  . Obesity   . Seizures ([place])     MEDICATIONS: Current Outpatient Medications on File Prior to Visit  Medication Sig Dispense Refill  . ferrous sulfate 325 (65 FE) MG EC tablet Take 325 mg by mouth 3 (three) times daily with meals.    . lamoTRIgine (LAMICTAL) 100 MG tablet Take 1.5 tablets twice a day 90 tablet 11  . Multiple Vitamin (MULTIVITAMIN) tablet Take 1 tablet by mouth daily.     No current facility-administered medications on file prior to visit.     ALLERGIES: Allergies  Allergen Reactions  . Orange Oil Swelling  . Penicillins Nausea And Vomiting    Has patient had a PCN reaction causing immediate rash, facial/tongue/throat swelling, SOB or lightheadedness with hypotension: yes Has patient had a PCN reaction causing severe rash involving mucus membranes or skin necrosis: no Has patient had a PCN reaction that required hospitalization : unknown Has patient had a PCN reaction occurring within the last 10 years: no If all of the above answers are "NO", then may proceed with Cephalosporin use.     FAMILY HISTORY: Family History  Problem Relation Age of Onset  . Hypertension Mother     SOCIAL HISTORY: Social History   Socioeconomic History  . Marital status: Married    Spouse name: Not on file  . Number of children: 1  . Years of education: S-College  . Highest education level: Not on file  Social Needs  . Financial resource strain: Not on file  . Food insecurity - worry: Not on file  . Food insecurity -  inability: Not on file  . Transportation needs - medical: Not on file  . Transportation needs - non-medical: Not on file  Occupational History  . Not on file  Tobacco Use  . Smoking status: Never Smoker  . Smokeless tobacco: Never Used  Substance and Sexual Activity  . Alcohol use: Yes    Comment: occasional  . Drug use: No  . Sexual activity: Not on file  Other Topics Concern  . Not on file  Social History Narrative  . Not on file    REVIEW OF SYSTEMS: Constitutional: No fevers, chills, or sweats, no generalized fatigue, change in appetite Eyes: No visual changes, double vision, eye pain Ear, nose and throat: No hearing loss, ear pain, nasal congestion, sore throat Cardiovascular: No chest pain, palpitations Respiratory:  No shortness of breath at rest or with exertion, wheezes GastrointestinaI: No nausea, vomiting, diarrhea, abdominal pain, fecal incontinence Genitourinary:  No dysuria, urinary retention or frequency Musculoskeletal:  No neck pain, back pain Integumentary: No rash, pruritus, skin lesions Neurological: as above Psychiatric: No depression, insomnia, anxiety Endocrine: No palpitations, fatigue, diaphoresis, mood swings, change in appetite, change in weight, increased thirst Hematologic/Lymphatic:  No anemia, purpura, petechiae.  Allergic/Immunologic: no itchy/runny eyes, nasal congestion, recent allergic reactions, rashes  PHYSICAL EXAM: Vitals:   06/04/17 0826  BP: 126/84  Pulse: 92  Resp: 10   General: No acute distress Head:  Normocephalic/atraumatic Neck: supple, no paraspinal tenderness, full range of motion Heart:  Regular rate and rhythm Lungs:  Clear to auscultation bilaterally Back: No paraspinal tenderness Skin/Extremities: No rash, no edema Neurological Exam: alert and oriented to person, place, and time. No aphasia or dysarthria. Fund of knowledge is appropriate.  Recent and remote memory are intact. Attention and concentration are normal.     Able to name objects and repeat phrases. Cranial nerves: Pupils equal, round, reactive to light.  Extraocular movements intact with no nystagmus. Visual fields full. Facial sensation intact. No facial asymmetry. Tongue, uvula, palate midline.  Motor: Bulk and tone normal, muscle strength 5/5 throughout with no pronator drift.  Sensation to light touch intact.  No extinction to double simultaneous stimulation.  Deep tendon reflexes 2+ throughout, toes downgoing.  Finger to nose testing intact.  Gait narrow-based and steady, able to tandem walk adequately.  Romberg negative.  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 Routine EEG in 2014 had shown left temporal slowing, MRI brain normal. She had been seizure-free for 2 years, then had a convulsive seizure in January 2018 and in June 2018, both in the setting of sleep deprivation and menstrual period. Her husband reports an episode of pause/unresponsiveness with lip smacking, which is new for her, last November. Check Lamictal level. She is currently on Lamotrigine 150mg  BID, depending on level, we will potentially plan to increase dose to 200mg  BID. They are looking into pregnancy in the next 5-6 months, we again discussed issues in women with epilepsy, more than 90% have healthy pregnancies, low risks of fetal malformation on Lamotrigine, and the need to check Lamotrigine levels monthly when pregnant. Recommend starting prenatal vitamins and 93mcg folic acid daily. We again discussed [place] driving laws to stop driving until 6 months seizure-free. She will follow-up in 3-4 months and knows to call our office for any changes.   Thank you for allowing me to participate in her care.  Please do not hesitate to call for any questions or concerns.  The duration of this appointment visit was 25 minutes of face-to-face time with the patient.  Greater than 50% of this time  was spent in counseling, explanation of diagnosis, planning of further management, and coordination of care.   Ellouise Newer, M.D.   CC: Dr. Justin Mend

## 2017-06-09 LAB — LAMOTRIGINE LEVEL: Lamotrigine Lvl: 6.5 ug/mL (ref 4.0–18.0)

## 2017-06-11 ENCOUNTER — Telehealth: Payer: Self-pay

## 2017-06-11 DIAGNOSIS — G40009 Localization-related (focal) (partial) idiopathic epilepsy and epileptic syndromes with seizures of localized onset, not intractable, without status epilepticus: Secondary | ICD-10-CM

## 2017-06-11 MED ORDER — LAMOTRIGINE 100 MG PO TABS: ORAL_TABLET | ORAL | 11 refills | Status: DC

## 2017-06-11 NOTE — Telephone Encounter (Signed)
-----   Message from Cameron Sprang, MD sent at 06/10/2017 11:30 AM EST ----- Pls let her know the Lamictal level is within range, there is still room for increase in dose. It is 6.5, typical range is between 4 to 18. Increase Lamotrigine 100mg , take 2 tabs BID. Pls send new Rx with refills, thanks

## 2017-06-11 NOTE — Telephone Encounter (Signed)
Spoke with pt relaying message below.  Verified pharmacy.  Rx sent.

## 2017-09-04 ENCOUNTER — Ambulatory Visit: Payer: 59 | Admitting: Neurology

## 2018-02-12 ENCOUNTER — Other Ambulatory Visit: Payer: Self-pay

## 2018-02-12 ENCOUNTER — Ambulatory Visit (INDEPENDENT_AMBULATORY_CARE_PROVIDER_SITE_OTHER): Payer: Managed Care, Other (non HMO) | Admitting: Neurology

## 2018-02-12 ENCOUNTER — Encounter: Payer: Self-pay | Admitting: Neurology

## 2018-02-12 VITALS — BP 112/64 | HR 92 | Ht 59.0 in | Wt 209.0 lb

## 2018-02-12 DIAGNOSIS — G40009 Localization-related (focal) (partial) idiopathic epilepsy and epileptic syndromes with seizures of localized onset, not intractable, without status epilepticus: Secondary | ICD-10-CM

## 2018-02-12 MED ORDER — FOLIC ACID 1 MG PO TABS: 1.0000 mg | ORAL_TABLET | Freq: Every day | ORAL | 3 refills | Status: DC

## 2018-02-12 MED ORDER — LAMOTRIGINE 100 MG PO TABS: ORAL_TABLET | ORAL | 11 refills | Status: DC

## 2018-02-12 NOTE — Patient Instructions (Addendum)
1. Have your Lamictal level checked first thing in the morning  Your provider has requested that you have LABS drawn in the FUTURE.  Our laboratory is located within [facility], [phone] of this building.  You do not need an appointment however, please come to [facility] prior to going to the lab - we will check you into the lab.     2. Start daily prenatal vitamins and daily folic acid  3. Continue Lamictal 100mg : Take 2 tablets twice a day 4. We will be checking your Lamictal level every month 5. Follow-up in 3 months, call for any changes  Seizure Precautions: 1. If medication has been prescribed for you to prevent seizures, take it exactly as directed.  Do not stop taking the medicine without talking to your doctor first, even if you have not had a seizure in a long time.   2. Avoid activities in which a seizure would cause danger to yourself or to others.  Don't operate dangerous machinery, swim alone, or climb in high or dangerous places, such as on ladders, roofs, or girders.  Do not drive unless your doctor says you may.  3. If you have any warning that you may have a seizure, lay down in a safe place where you can't hurt yourself.    4.  No driving for 6 months from last seizure, as per [facility].   Please refer to the following link on the [place] website for more information: [email or web address]/[email or web address]   5.  Maintain good sleep hygiene. Avoid alcohol.  6.  Notify your neurology if you are planning pregnancy or if you become pregnant.  7.  Contact your doctor if you have any problems that may be related to the medicine you are taking.  8.  Call 911 and bring the patient back to the ED if:        A.  The seizure lasts longer than 5 minutes.       B.  The patient doesn't awaken shortly after the seizure  C.  The patient has new problems such as difficulty seeing, speaking or  moving  D.  The patient was injured during the seizure  E.  The patient has a temperature over 102 F (39C)  F.  The patient vomited and now is having trouble breathing

## 2018-02-12 NOTE — Progress Notes (Signed)
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 On her last visit, her husband reported a focal seizure with impaired awareness lasting 10-15 seconds in November 2018. Lamictal dose increased to 200mg  BID. She has not had any convulsions since June 2018. She and her husband deny any further seizures. She denies any side effects on Lamictal. She denies any headaches, dizziness, diplopia, gait instability. She had a positive home pregnancy test last week, LMP Sept 2, and is awaiting OB follow-up. She denies any nausea, vomiting, no falls.  HPI: This is a pleasant 37 yo RH woman with seizures since 09/01/2012. She was at home when her husband heard her fall on the floor and found her shaking with all extremities extended. She had told him she felt tired that day after being up for 72 hours studying and finishing a paper for school. She woke up with EMS around her, feeling tired, no focal weakness. Per EMS report, she had a post-ictal phase of approximately 30 minutes. CBC showed a WBC of 10.7, BMP unremarkable. She had seen neurologist Dr. Jannifer Franklin and routine EEG done showed intermittent dysrhythmic theta slowing from the left mid-temporal region, with occasional sharp transients. At no time did there appear to be evidence of spike or spike wave discharges. She was unable to do MRI brain ordered. She was started on Keppra 500mg  BID, however she felt very sleepy and unable to function on this, and had self-reduced dose to 250mg  daily. She had been on this dose and had another seizure that occurred out of sleep in June/July 2015. Her husband heard her shaking, with urinary incontinence and  tongue bite. She may have been sleep-deprived that time. She did not seek medical attention. On 02/24/14, she was asleep at 4am when her husband woke up to her having a convulsion that lasted 30 seconds. She went back to sleep. He came back in the room at 10am and she recalls asking him if she had another seizure, he reported that she looked like she had another one and there was a wet spot on her pajamas. Around noon, he heard a noise but thought it was a dog outside. Then at 3:30pm, he was in the room and again heard the same noise and found her having another convulsion. She was brought to [facility] where she was noted to be febrile with rectal temperature of 101.3. Her WBC was 17.2. She had a head CT which I personally reviewed which was normal. She had a lumbar puncture which was traumatic with 700 RBC pink hazy CSF, 2 WBC, protein 13, glucose 85, gram stain and culture, VDRL, HSV, and Cryptococcal Ab negative. She was started on Dilantin, last level 8.0, discharged home on Dilantin 100mg  in AM, 150mg  at 3pm, 100mg  qhs which also caused drowsiness. She was started on Lamictal in October 2015.   She had a breakthrough seizure last 05/30/16 after being seizure-free for 2 years on Lamotrigine 100mg  BID with no side effects. She was at work then recalls feeling extremely nauseated. She had a bad night of sleep the night prior due to significant menstrual cramps, which is unusual for her. She denied any infection, no missed medication.   Her  husband had noticed infrequent blanking out episodes lasting 10 seconds a year ago, none recently. She denies any gaps in time, no olfactory/gustatory hallucinations, deja vu, rising epigastric sensation, focal numbness/tingling/weakness, myoclonic jerks.   Epilepsy Risk Factors: Her paternal half-sister has seizures. She had 2 concussions in her 39s, one with loss of consciousness. Otherwise she had a normal birth and early development. There is no history of febrile  convulsions, CNS infections such as meningitis/encephalitis, significant traumatic brain injury, neurosurgical procedures.  Prior AEDs: Keppra, Dilantin  Lamictal level 06/05/17 on LTG 150mg  BID: 6.5   PAST MEDICAL HISTORY: Past Medical History:  Diagnosis Date  . Convulsions/seizures ([place]) 09/03/2012  . Obesity   . Seizures ([place])     MEDICATIONS: Current Outpatient Medications on File Prior to Visit  Medication Sig Dispense Refill  . ferrous sulfate 325 (65 FE) MG EC tablet Take 325 mg by mouth 3 (three) times daily with meals.    . lamoTRIgine (LAMICTAL) 100 MG tablet Take 1.5 tablets twice a day 120 tablet 11  . Multiple Vitamin (MULTIVITAMIN) tablet Take 1 tablet by mouth daily.     No current facility-administered medications on file prior to visit.     ALLERGIES: Allergies  Allergen Reactions  . Orange Oil Swelling  . Penicillins Nausea And Vomiting    Has patient had a PCN reaction causing immediate rash, facial/tongue/throat swelling, SOB or lightheadedness with hypotension: yes Has patient had a PCN reaction causing severe rash involving mucus membranes or skin necrosis: no Has patient had a PCN reaction that required hospitalization : unknown Has patient had a PCN reaction occurring within the last 10 years: no If all of the above answers are "NO", then may proceed with Cephalosporin use.     FAMILY HISTORY: Family History  Problem Relation Age of Onset  . Hypertension Mother     SOCIAL HISTORY: Social History   Socioeconomic History  . Marital status: Married    Spouse name: Not on file  . Number of children: 1  . Years of education: S-College  . Highest education level: Not on file  Occupational History  . Not on file  Social Needs  . Financial resource strain: Not on file  . Food insecurity:    Worry: Not on file    Inability: Not on file  . Transportation needs:    Medical: Not on file    Non-medical: Not on file  Tobacco Use  . Smoking  status: Never Smoker  . Smokeless tobacco: Never Used  Substance and Sexual Activity  . Alcohol use: Yes    Comment: occasional  . Drug use: No  . Sexual activity: Not on file  Lifestyle  . Physical activity:    Days per week: Not on file    Minutes per session: Not on file  . Stress: Not on file  Relationships  . Social connections:    Talks on phone: Not on file    Gets together: Not on file    Attends religious service: Not on file    Active member of club or organization: Not on file    Attends meetings of clubs or organizations: Not on file    Relationship status: Not on file  . Intimate partner violence:    Fear of current or ex partner: Not on file    Emotionally abused: Not on file    Physically abused: Not on file    Forced sexual activity: Not on file  Other Topics Concern  .  Not on file  Social History Narrative  . Not on file    REVIEW OF SYSTEMS: Constitutional: No fevers, chills, or sweats, no generalized fatigue, change in appetite Eyes: No visual changes, double vision, eye pain Ear, nose and throat: No hearing loss, ear pain, nasal congestion, sore throat Cardiovascular: No chest pain, palpitations Respiratory:  No shortness of breath at rest or with exertion, wheezes GastrointestinaI: No nausea, vomiting, diarrhea, abdominal pain, fecal incontinence Genitourinary:  No dysuria, urinary retention or frequency Musculoskeletal:  No neck pain, back pain Integumentary: No rash, pruritus, skin lesions Neurological: as above Psychiatric: No depression, insomnia, anxiety Endocrine: No palpitations, fatigue, diaphoresis, mood swings, change in appetite, change in weight, increased thirst Hematologic/Lymphatic:  No anemia, purpura, petechiae. Allergic/Immunologic: no itchy/runny eyes, nasal congestion, recent allergic reactions, rashes  PHYSICAL EXAM: Vitals:   02/12/18 0832  BP: 112/64  Pulse: 92  SpO2: 98%   General: No acute distress Head:   Normocephalic/atraumatic Neck: supple, no paraspinal tenderness, full range of motion Heart:  Regular rate and rhythm Lungs:  Clear to auscultation bilaterally Back: No paraspinal tenderness Skin/Extremities: No rash, no edema Neurological Exam: alert and oriented to person, place, and time. No aphasia or dysarthria. Fund of knowledge is appropriate.  Recent and remote memory are intact. Attention and concentration are normal.    Able to name objects and repeat phrases. Cranial nerves: Pupils equal, round, reactive to light.  Extraocular movements intact with no nystagmus. Visual fields full. Facial sensation intact. No facial asymmetry. Tongue, uvula, palate midline.  Motor: Bulk and tone normal, muscle strength 5/5 throughout with no pronator drift.  Sensation to light touch intact.  No extinction to double simultaneous stimulation. Finger to nose testing intact.  Gait narrow-based and steady, able to tandem walk adequately.    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 Routine EEG in 2014 had shown left temporal slowing, MRI brain normal. Longest seizure-free interval is 2 years, last GTC in June 2018, last partial seizure November 2018. She is now on Lamotrigine 200mg  BID without side effects. She had a positive home pregnancy test last week. Check Lamictal level, we discussed doing Lamictal level monthly during pregnancy. She was instructed to start taking a prenatal vitamin and folic acid 1mg  daily. We discussed safety precautions, all their questions were answered to the best of my abilities. She is aware of [place] driving laws to stop driving until 6 months seizure-free. She will follow-up in 3 months and knows to call our office for any changes.   Thank you for allowing me to participate in her care.  Please do not hesitate to call for any questions or concerns.  The duration of this appointment  visit was 20 minutes of face-to-face time with the patient.  Greater than 50% of this time was spent in counseling, explanation of diagnosis, planning of further management, and coordination of care.   Ellouise Newer, M.D.   CC: Dr. Justin Mend

## 2018-02-13 ENCOUNTER — Other Ambulatory Visit (INDEPENDENT_AMBULATORY_CARE_PROVIDER_SITE_OTHER): Payer: Managed Care, Other (non HMO)

## 2018-02-13 DIAGNOSIS — G40009 Localization-related (focal) (partial) idiopathic epilepsy and epileptic syndromes with seizures of localized onset, not intractable, without status epilepticus: Secondary | ICD-10-CM | POA: Diagnosis not present

## 2018-02-16 ENCOUNTER — Other Ambulatory Visit (INDEPENDENT_AMBULATORY_CARE_PROVIDER_SITE_OTHER): Payer: Managed Care, Other (non HMO)

## 2018-02-16 VITALS — BP 134/72 | HR 72 | Wt 210.8 lb

## 2018-02-16 DIAGNOSIS — Z3201 Encounter for pregnancy test, result positive: Secondary | ICD-10-CM | POA: Diagnosis not present

## 2018-02-16 LAB — POCT URINE PREGNANCY: Preg Test, Ur: POSITIVE — AB

## 2018-02-16 NOTE — Addendum Note (Signed)
Addended by: Phillip Heal,  A on: 02/16/2018 09:50 AM   Modules accepted: Orders

## 2018-02-16 NOTE — Progress Notes (Signed)
Ms. Schlauch presents today for UPT. She complaints positive pregnancy test at home. No other unusual complaints. LMP: around  01/05/2018    OBJECTIVE: Appears well, in no apparent distress.  OB History   None    Home UPT Result: positive  In-Office UPT result:positive  I have reviewed the patient's medical, obstetrical, social, and family histories, and medications.   ASSESSMENT: Positive pregnancy test  PLAN Prenatal care to be completed at: Will start in a couple of weeks

## 2018-02-19 ENCOUNTER — Telehealth: Payer: Self-pay

## 2018-02-19 LAB — LAMOTRIGINE LEVEL: LAMOTRIGINE LVL: 9 ug/mL (ref 4.0–18.0)

## 2018-02-19 NOTE — Telephone Encounter (Signed)
-----   Message from Cameron Sprang, MD sent at 02/19/2018 12:16 PM EDT ----- Pls let her know the Lamictal level is good, 9.0. We will continue with monitoring this every month and keep the level at 9. Thanks

## 2018-02-19 NOTE — Telephone Encounter (Signed)
LMOM asking for return call to relay message below.  

## 2018-02-19 NOTE — Telephone Encounter (Signed)
Pt returned my call.  Relayed recent Lamictal levels.  Advised that we will re-check in a month to make sure that level are being maintained.

## 2018-02-19 NOTE — Telephone Encounter (Signed)
ERROR pls see previous phone encounter    

## 2018-02-23 ENCOUNTER — Ambulatory Visit (HOSPITAL_COMMUNITY)
Admission: RE | Admit: 2018-02-23 | Discharge: 2018-02-23 | Disposition: A | Discharge status: Home / Self Care | Payer: Managed Care, Other (non HMO) | Source: Ambulatory Visit | Attending: Obstetrics & Gynecology | Admitting: Obstetrics & Gynecology

## 2018-02-23 ENCOUNTER — Encounter: Payer: Self-pay | Admitting: Obstetrics & Gynecology

## 2018-02-23 ENCOUNTER — Ambulatory Visit (INDEPENDENT_AMBULATORY_CARE_PROVIDER_SITE_OTHER): Payer: Managed Care, Other (non HMO) | Admitting: Obstetrics & Gynecology

## 2018-02-23 VITALS — BP 134/82 | Wt 210.8 lb

## 2018-02-23 DIAGNOSIS — O209 Hemorrhage in early pregnancy, unspecified: Secondary | ICD-10-CM

## 2018-02-23 DIAGNOSIS — Z3A00 Weeks of gestation of pregnancy not specified: Secondary | ICD-10-CM | POA: Insufficient documentation

## 2018-02-23 DIAGNOSIS — O208 Other hemorrhage in early pregnancy: Secondary | ICD-10-CM

## 2018-02-23 DIAGNOSIS — Z113 Encounter for screening for infections with a predominantly sexual mode of transmission: Secondary | ICD-10-CM

## 2018-02-23 DIAGNOSIS — N898 Other specified noninflammatory disorders of vagina: Secondary | ICD-10-CM | POA: Diagnosis not present

## 2018-02-23 DIAGNOSIS — O26899 Other specified pregnancy related conditions, unspecified trimester: Secondary | ICD-10-CM | POA: Diagnosis not present

## 2018-02-23 DIAGNOSIS — D259 Leiomyoma of uterus, unspecified: Secondary | ICD-10-CM | POA: Diagnosis not present

## 2018-02-23 NOTE — Progress Notes (Signed)
   OBSTETRIC OFFICE VISIT NOTE  History:  37 y.o. G1P0 here today for evaluation for possible miscarriage. Had positive UPT here in office on 02/16/18.Patient noticed spotting one week ago since then the bleeding has picked up. Changing pads every 2-3 hours with some associated cramping.  Denies any lightheadedness, dizziness or other concerns.   Past Medical History:  Diagnosis Date  . Convulsions/seizures ([place]) 09/03/2012  . Obesity   . Seizures ([place])     Past Surgical History:  Procedure Laterality Date  . CESAREAN SECTION     x 1    The following portions of the patient's history were reviewed and updated as appropriate: allergies, current medications, past family history, past medical history, past social history, past surgical history and problem list.    Review of Systems:  Pertinent items noted in HPI and remainder of comprehensive ROS otherwise negative.  Objective:  Physical Exam BP 134/82   Wt 210 lb 12.8 oz (95.6 kg)   BMI 42.58 kg/m  CONSTITUTIONAL: Well-developed, well-nourished female in no acute distress.  HEENT:  Normocephalic, atraumatic. External right and left ear normal. No scleral icterus.  NECK: Normal range of motion, supple, no masses noted on observation SKIN: Skin is warm and dry. No rash noted. Not diaphoretic. No erythema. No pallor. MUSCULOSKELETAL: Normal range of motion. No edema noted. NEUROLOGIC: Alert and oriented to person, place, and time. Normal muscle tone coordination. No cranial nerve deficit noted. PSYCHIATRIC: Normal mood and affect. Normal behavior. Normal judgment and thought content. CARDIOVASCULAR: Normal heart rate noted RESPIRATORY: Effort and breath sounds normal, no problems with respiration noted ABDOMEN: Soft, nontender, no distention noted.   PELVIC: Normal appearing external genitalia; normal appearing vaginal mucosa and closed cervix.  Dark blood noted in vault, wiped away with 4 foxswabs. No active bleeding noted.  Normal  uterine size, no other palpable masses, no uterine or adnexal tenderness.  Assessment & Plan:  1. Vaginal bleeding affecting early pregnancy Possible threatened miscarriage, but cannot rule out ectopic or other abnormal pregnancy. Discussed vaginal bleeding in early pregnancy with patient in detail, need for close surveillance in some cases.  Labs done today, STAT ultrasound ordered. Will follow up results and manage accordingly. - Beta hCG quant (ref lab) - CBC - ABO AND RH  - US OB LESS THAN 14 WEEKS WITH OB TRANSVAGINAL; STAT - Cervicovaginal ancillary only  Total face-to-face time with patient: 20 minutes.  Over 50% of encounter was spent on counseling and coordination of care.   Verita Schneiders, MD, [place] for [employer], [place]

## 2018-02-23 NOTE — Patient Instructions (Signed)
Thank you for enrolling in [facility]. Please follow the instructions below to securely access your online medical record. MyChart allows you to send messages to your doctor, view your test results, manage appointments, and more.   How Do I Sign Up? 1. In your Internet browser, go to [employer] and enter [email or web address]. 2. Click on the Sign Up Now link in the Sign In box. You will see the New Member Sign Up page. 3. Enter your MyChart Access Code exactly as it appears below. You will not need to use this code after you've completed the sign-up process. If you do not sign up before the expiration date, you must request a new code.  MyChart Access Code: [email or web address] Expires: 04/09/2018  1:33 PM  4. Enter your Social Security Number ([id number]) and Date of Birth (mm/dd/yyyy) as indicated and click Submit. You will be taken to the next sign-up page. 5. Create a MyChart ID. This will be your MyChart login ID and cannot be changed, so think of one that is secure and easy to remember. 6. Create a MyChart password. You can change your password at any time. 7. Enter your Password Reset Question and Answer. This can be used at a later time if you forget your password.  8. Enter your e-mail address. You will receive e-mail notification when new information is available in [place]. 9. Click Sign Up. You can now view your medical record.   Additional Information Remember, MyChart is NOT to be used for urgent needs. For medical emergencies, dial 911.    Vaginal Bleeding During Pregnancy, First Trimester A small amount of bleeding (spotting) from the vagina is common in early pregnancy. Sometimes the bleeding is normal and is not a problem, and sometimes it is a sign of something serious. Be sure to tell your doctor about any bleeding from your vagina right away. Follow these instructions at home:  Watch your condition for any changes.  Follow your doctor's instructions about how  active you can be.  If you are on bed rest: ? You may need to stay in bed and only get up to use the bathroom. ? You may be allowed to do some activities. ? If you need help, make plans for someone to help you.  Write down: ? The number of pads you use each day. ? How often you change pads. ? How soaked (saturated) your pads are.  Do not use tampons.  Do not douche.  Do not have sex or orgasms until your doctor says it is okay.  If you pass any tissue from your vagina, save the tissue so you can show it to your doctor.  Only take medicines as told by your doctor.  Do not take aspirin because it can make you bleed.  Keep all follow-up visits as told by your doctor. Contact a doctor if:  You bleed from your vagina.  You have cramps.  You have labor pains.  You have a fever that does not go away after you take medicine. Get help right away if:  You have very bad cramps in your back or belly (abdomen).  You pass large clots or tissue from your vagina.  You bleed more.  You feel light-headed or weak.  You pass out (faint).  You have chills.  You are leaking fluid or have a gush of fluid from your vagina.  You pass out while pooping (having a bowel movement). This information is not intended to replace advice given to you  by your health care provider. Make sure you discuss any questions you have with your health care provider. Document Released: 09/06/2013 Document Revised: 09/28/2015 Document Reviewed: 12/28/2012 Elsevier Interactive Patient Education  [employer].

## 2018-02-24 LAB — CBC
Hematocrit: 40.5 % (ref 34.0–46.6)
Hemoglobin: 13.4 g/dL (ref 11.1–15.9)
MCH: 27.3 pg (ref 26.6–33.0)
MCHC: 33.1 g/dL (ref 31.5–35.7)
MCV: 83 fL (ref 79–97)
PLATELETS: 377 10*3/uL (ref 150–450)
RBC: 4.9 x10E6/uL (ref 3.77–5.28)
RDW: 13.4 % (ref 12.3–15.4)
WBC: 9.4 10*3/uL (ref 3.4–10.8)

## 2018-02-24 LAB — CERVICOVAGINAL ANCILLARY ONLY
Bacterial vaginitis: NEGATIVE
CANDIDA VAGINITIS: NEGATIVE
Chlamydia: NEGATIVE
Neisseria Gonorrhea: NEGATIVE
TRICH (WINDOWPATH): NEGATIVE

## 2018-02-24 LAB — ABO AND RH: Rh Factor: POSITIVE

## 2018-02-24 LAB — BETA HCG QUANT (REF LAB): hCG Quant: 74 m[IU]/mL

## 2018-02-24 NOTE — Telephone Encounter (Signed)
I called patient around 4:30 pm today but there was no answer. She was advised to call back tomorrow to speak with nursing staff. She needs to come in for repeat HCG soon.   Verita Schneiders, MD, [place] for [employer] [facility], [place]

## 2018-02-26 ENCOUNTER — Other Ambulatory Visit: Payer: Managed Care, Other (non HMO)

## 2018-02-26 ENCOUNTER — Other Ambulatory Visit: Payer: Self-pay

## 2018-02-26 DIAGNOSIS — Z3200 Encounter for pregnancy test, result unknown: Secondary | ICD-10-CM

## 2018-02-27 LAB — BETA HCG QUANT (REF LAB): hCG Quant: 10 m[IU]/mL

## 2018-03-02 ENCOUNTER — Ambulatory Visit: Payer: Managed Care, Other (non HMO) | Admitting: Obstetrics & Gynecology

## 2018-03-16 ENCOUNTER — Ambulatory Visit (INDEPENDENT_AMBULATORY_CARE_PROVIDER_SITE_OTHER): Payer: Managed Care, Other (non HMO) | Admitting: Obstetrics & Gynecology

## 2018-03-16 ENCOUNTER — Encounter: Payer: Self-pay | Admitting: Obstetrics & Gynecology

## 2018-03-16 VITALS — BP 139/86 | HR 85 | Wt 214.0 lb

## 2018-03-16 DIAGNOSIS — D219 Benign neoplasm of connective and other soft tissue, unspecified: Secondary | ICD-10-CM | POA: Diagnosis not present

## 2018-03-16 DIAGNOSIS — O039 Complete or unspecified spontaneous abortion without complication: Secondary | ICD-10-CM

## 2018-03-16 NOTE — Progress Notes (Signed)
GYNECOLOGY OFFICE VISIT NOTE  History:  37 y.o. G2P1011 here today for discussion of recent ultrasound results. She is s/p recent miscarriage, needs one more HCG draw today.  Ultrasound showed fibroids, she wanted to discuss the implications of this with regards to her miscarriage and future pregnancies. She denies any abnormal vaginal discharge, bleeding, pelvic pain or other concerns.   Past Medical History:  Diagnosis Date  . Convulsions/seizures ([place]) 09/03/2012  . Obesity   . Seizures ([place])     Past Surgical History:  Procedure Laterality Date  . CESAREAN SECTION     x 1   The following portions of the patient's history were reviewed and updated as appropriate: allergies, current medications, past family history, past medical history, past social history, past surgical history and problem list.    Review of Systems:  Pertinent items noted in HPI and remainder of comprehensive ROS otherwise negative.  Objective:  Physical Exam BP 139/86   Pulse 85   Wt 214 lb (97.1 kg)   LMP 01/05/2018 (Approximate)   Breastfeeding? Unknown   BMI 43.22 kg/m  CONSTITUTIONAL: Well-developed, well-nourished female in no acute distress.  HEENT:  Normocephalic, atraumatic. External right and left ear normal. No scleral icterus.  NECK: Normal range of motion, supple, no masses noted on observation SKIN: Skin is warm and dry. No rash noted. Not diaphoretic. No erythema. No pallor. MUSCULOSKELETAL: Normal range of motion. No edema noted. NEUROLOGIC: Alert and oriented to person, place, and time. Normal muscle tone coordination. No cranial nerve deficit noted. PSYCHIATRIC: Normal mood and affect. Normal behavior. Normal judgment and thought content. CARDIOVASCULAR: Normal heart rate noted RESPIRATORY: Effort and breath sounds normal, no problems with respiration noted ABDOMEN: Soft, no overt distention noted.   PELVIC: Deferred  Labs and Imaging No results found for this or any previous  visit (from the past 168 hour(s)). US Ob Less Than 14 Weeks With Ob Transvaginal  Result Date: 02/23/2018 CLINICAL DATA:  Early pregnancy.  Vaginal bleeding. EXAM: OBSTETRIC <14 WK [place] AND TRANSVAGINAL OB US TECHNIQUE: Both transabdominal and transvaginal ultrasound examinations were performed for complete evaluation of the gestation as well as the maternal uterus, adnexal regions, and pelvic cul-de-sac. Transvaginal technique was performed to assess early pregnancy. COMPARISON:  None. FINDINGS: Intrauterine gestational sac: None Yolk sac:  Not Visualized. Embryo:  Not Visualized. Cardiac Activity: Not Visualized. Subchorionic hemorrhage:  None visualized. Maternal uterus/adnexae: Right ovary: Normal Left ovary: Normal Other :Endometrial thickness measures 9.4 mm. At least 5 fibroids are identified. The largest is intramural and is in the anterior fundus measuring 2.3 x 1.5 x 1.7 cm. Free fluid:  None IMPRESSION: 1. No intrauterine gestational sac, yolk sac, or fetal pole identified. Differential considerations include intrauterine pregnancy too early to be sonographically visualized, missed abortion, or ectopic pregnancy. Followup ultrasound is recommended in 10-14 days for further evaluation. 2. Uterine fibroids. Electronically Signed   By: Kerby Moors M.D.   On: 02/23/2018 15:35   Results for NAKHIA, LEVITAN (MRN [id number]) as of 03/16/2018 15:57  Ref. Range 02/16/2018 09:50 02/23/2018 13:45 02/26/2018 08:25  hCG Quant Latest Units: mIU/mL  74 10  Preg Test, Ur Latest Ref Range: Negative  Positive (A)     Assessment & Plan:  1. Miscarriage Will get follow up HCG today. Counseled about early SAB, likely genetic. Unlikely related to small fibroids.   - Beta hCG quant (ref lab)  2. Fibroids Discussed small fibroids. No surgery indicated, intramural in nature.  Should not interfere with  conception/carrying pregnancy given the small size.  Patient reassured.   Patient was told to use vitamins with  folic acid, can ovulate and conceive at any time. Also recommended weight loss, this helps with overall good health maintenance.  Routine preventative health maintenance measures emphasized. Please refer to After Visit Summary for other counseling recommendations.   Return for any gynecologic concerns.   Total face-to-face time with patient: 15 minutes.  Over 50% of encounter was spent on counseling and coordination of care.   Verita Schneiders, MD, [place] for [employer], [place]

## 2018-03-17 LAB — BETA HCG QUANT (REF LAB)

## 2018-03-30 ENCOUNTER — Encounter: Payer: Managed Care, Other (non HMO) | Admitting: Obstetrics & Gynecology

## 2018-03-31 ENCOUNTER — Encounter: Payer: Managed Care, Other (non HMO) | Admitting: Obstetrics & Gynecology

## 2018-05-15 ENCOUNTER — Ambulatory Visit: Payer: Managed Care, Other (non HMO) | Admitting: Neurology

## 2018-12-02 ENCOUNTER — Ambulatory Visit: Payer: Managed Care, Other (non HMO) | Admitting: Neurology

## 2018-12-08 ENCOUNTER — Telehealth (INDEPENDENT_AMBULATORY_CARE_PROVIDER_SITE_OTHER): Payer: BC Managed Care – PPO | Admitting: Neurology

## 2018-12-08 ENCOUNTER — Other Ambulatory Visit: Payer: Self-pay

## 2018-12-08 ENCOUNTER — Encounter: Payer: Self-pay | Admitting: Neurology

## 2018-12-08 DIAGNOSIS — G40009 Localization-related (focal) (partial) idiopathic epilepsy and epileptic syndromes with seizures of localized onset, not intractable, without status epilepticus: Secondary | ICD-10-CM | POA: Diagnosis not present

## 2018-12-08 MED ORDER — LAMOTRIGINE 100 MG PO TABS: ORAL_TABLET | ORAL | 11 refills | Status: DC

## 2018-12-08 NOTE — Progress Notes (Signed)
Virtual Visit via Video Note The purpose of this virtual visit is to provide medical care while limiting exposure to the novel coronavirus.    Consent was obtained for video visit:  Yes.   Answered questions that patient had about telehealth interaction:  Yes.   I discussed the limitations, risks, security and privacy concerns of performing an evaluation and management service by telemedicine. I also discussed with the patient that there may be a patient responsible charge related to this service. The patient expressed understanding and agreed to proceed.  Pt location: Home Physician Location: office Name of referring provider:  [name], MD I connected with Anne Benjamin at patients initiation/request on 12/08/2018 at  4:00 PM EDT by video enabled telemedicine application and verified that I am speaking with the correct person using two identifiers. Pt MRN:  [id number] Pt DOB:  1981-04-23 Video Participants:  Anne Benjamin   History of Present Illness:  The patient was seen as a virtual video visit on 12/08/2018 for recurrent seizures, likely arising from the left temporal lobe. She was last seen in October 2019. Since then, she unfortunately had a miscarriage a few days after her last visit. She contacted our office about seizures on 12/3 and 12/4 in the setting of a cold. She denies any further seizures since 04/08/2018. She is taking Lamotrigine 200mg  BID without side effects. Lamotrigine level was 9.0 in 02/2018 on current dose. She denies any staring/unresponsive episodes, gaps in time, olfactory/gustatory hallucinations, focal numbness/tingling/weakness, myoclonic jerks. No headaches, dizziness, no falls. She had a positive pregnancy test a week ago and has her first OB visit next month. She is on daily folic acid and prenatal vitamins.   HPI: This is a pleasant 38 yo RH woman with seizures since 09/01/2012. She was at home when her husband heard her fall on the floor and found her shaking with  all extremities extended. She had told him she felt tired that day after being up for 72 hours studying and finishing a paper for school. She woke up with EMS around her, feeling tired, no focal weakness. Per EMS report, she had a post-ictal phase of approximately 30 minutes. CBC showed a WBC of 10.7, BMP unremarkable. She had seen neurologist Dr. Jannifer Franklin and routine EEG done showed intermittent dysrhythmic theta slowing from the left mid-temporal region, with occasional sharp transients. At no time did there appear to be evidence of spike or spike wave discharges. She was unable to do MRI brain ordered. She was started on Keppra 500mg  BID, however she felt very sleepy and unable to function on this, and had self-reduced dose to 250mg  daily. She had been on this dose and had another seizure that occurred out of sleep in June/July 2015. Her husband heard her shaking, with urinary incontinence and tongue bite. She may have been sleep-deprived that time. She did not seek medical attention. On 02/24/14, she was asleep at 4am when her husband woke up to her having a convulsion that lasted 30 seconds. She went back to sleep. He came back in the room at 10am and she recalls asking him if she had another seizure, he reported that she looked like she had another one and there was a wet spot on her pajamas. Around noon, he heard a noise but thought it was a dog outside. Then at 3:30pm, he was in the room and again heard the same noise and found her having another convulsion. She was brought to [facility] where she was noted to  be febrile with rectal temperature of 101.3. Her WBC was 17.2. She had a head CT which I personally reviewed which was normal. She had a lumbar puncture which was traumatic with 700 RBC pink hazy CSF, 2 WBC, protein 13, glucose 85, gram stain and culture, VDRL, HSV, and Cryptococcal Ab negative. She was started on Dilantin, last level 8.0, discharged home on Dilantin 100mg  in AM, 150mg  at 3pm, 100mg  qhs which  also caused drowsiness. She was started on Lamictal in October 2015.   She had a breakthrough seizure last 05/30/16 after being seizure-free for 2 years on Lamotrigine 100mg  BID with no side effects. She was at work then recalls feeling extremely nauseated. She had a bad night of sleep the night prior due to significant menstrual cramps, which is unusual for her. She denied any infection, no missed medication.   Her husband had noticed infrequent blanking out episodes lasting 10 seconds a year ago, none recently. She denies any gaps in time, no olfactory/gustatory hallucinations, deja vu, rising epigastric sensation, focal numbness/tingling/weakness, myoclonic jerks.   Epilepsy Risk Factors: Her paternal half-sister has seizures. She had 2 concussions in her 74s, one with loss of consciousness. Otherwise she had a normal birth and early development. There is no history of febrile convulsions, CNS infections such as meningitis/encephalitis, significant traumatic brain injury, neurosurgical procedures.  Prior AEDs: Keppra, Dilantin  Lamictal level 06/05/17 on LTG 150mg  BID: 6.5     Current Outpatient Medications on File Prior to Visit  Medication Sig Dispense Refill  . ferrous sulfate 325 (65 FE) MG EC tablet Take 325 mg by mouth 3 (three) times daily with meals.    . folic acid (FOLVITE) 1 MG tablet Take 1 tablet (1 mg total) by mouth daily. 90 tablet 3  . lamoTRIgine (LAMICTAL) 100 MG tablet Take 2 tablets twice a day 120 tablet 11  . Multiple Vitamin (MULTIVITAMIN) tablet Take 1 tablet by mouth daily.     No current facility-administered medications on file prior to visit.      Observations/Objective:   Vitals:   12/08/18 1605  Weight: 218 lb (98.9 kg)  Height: 4\' 11"  (1.499 m)   GEN:  The patient appears stated age and is in NAD.  Neurological examination: Patient is awake, alert, oriented x 3. No aphasia or dysarthria. Intact fluency and comprehension. Remote and recent memory  intact. Able to name and repeat. Cranial nerves: Extraocular movements intact with no nystagmus. No facial asymmetry. Motor: moves all extremities symmetrically, at least anti-gravity x 4. No incoordination on finger to nose testing.   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 Routine EEG in 2014 had shown left temporal slowing, MRI brain normal. Longest seizure-free interval is 2 years. Her last seizure was 04/08/2018. Continue Lamotrigine 200mg  BID. She had a positive pregnancy test last week, we discussed doing Lamictal level monthly during pregnancy. Continue daily folic acid and prenatal vitamins. We discussed that since she appears sensitive to increased seizures with infection, during that time period she can take 250mg  BID of Lamotrigine. She is aware of [place] driving laws to stop driving until 6 months seizure-free. She will follow-up in 6 months and knows to call our office for any changes.    Follow Up Instructions:    -I discussed the assessment and treatment plan with the patient. The patient was provided an opportunity to ask questions and  all were answered. The patient agreed with the plan and demonstrated an understanding of the instructions.   The patient was advised to call back or seek an in-person evaluation if the symptoms worsen or if the condition fails to improve as anticipated.   Cameron Sprang, MD

## 2018-12-09 ENCOUNTER — Other Ambulatory Visit: Payer: Self-pay

## 2018-12-09 DIAGNOSIS — Z3490 Encounter for supervision of normal pregnancy, unspecified, unspecified trimester: Secondary | ICD-10-CM

## 2018-12-09 DIAGNOSIS — G40009 Localization-related (focal) (partial) idiopathic epilepsy and epileptic syndromes with seizures of localized onset, not intractable, without status epilepticus: Secondary | ICD-10-CM

## 2019-01-12 ENCOUNTER — Ambulatory Visit (INDEPENDENT_AMBULATORY_CARE_PROVIDER_SITE_OTHER): Payer: BC Managed Care – PPO | Admitting: Obstetrics & Gynecology

## 2019-01-12 ENCOUNTER — Encounter: Payer: Self-pay | Admitting: Obstetrics & Gynecology

## 2019-01-12 ENCOUNTER — Other Ambulatory Visit: Payer: Self-pay

## 2019-01-12 VITALS — BP 121/85 | HR 97 | Wt 222.0 lb

## 2019-01-12 DIAGNOSIS — Z3A11 11 weeks gestation of pregnancy: Secondary | ICD-10-CM

## 2019-01-12 DIAGNOSIS — Z362 Encounter for other antenatal screening follow-up: Secondary | ICD-10-CM | POA: Diagnosis not present

## 2019-01-12 DIAGNOSIS — O99210 Obesity complicating pregnancy, unspecified trimester: Secondary | ICD-10-CM | POA: Insufficient documentation

## 2019-01-12 DIAGNOSIS — G40909 Epilepsy, unspecified, not intractable, without status epilepticus: Secondary | ICD-10-CM

## 2019-01-12 DIAGNOSIS — Z98891 History of uterine scar from previous surgery: Secondary | ICD-10-CM | POA: Insufficient documentation

## 2019-01-12 DIAGNOSIS — O34219 Maternal care for unspecified type scar from previous cesarean delivery: Secondary | ICD-10-CM | POA: Insufficient documentation

## 2019-01-12 DIAGNOSIS — O99211 Obesity complicating pregnancy, first trimester: Secondary | ICD-10-CM

## 2019-01-12 DIAGNOSIS — Z113 Encounter for screening for infections with a predominantly sexual mode of transmission: Secondary | ICD-10-CM

## 2019-01-12 DIAGNOSIS — O0990 Supervision of high risk pregnancy, unspecified, unspecified trimester: Secondary | ICD-10-CM

## 2019-01-12 DIAGNOSIS — O0991 Supervision of high risk pregnancy, unspecified, first trimester: Secondary | ICD-10-CM

## 2019-01-12 DIAGNOSIS — Z124 Encounter for screening for malignant neoplasm of cervix: Secondary | ICD-10-CM

## 2019-01-12 DIAGNOSIS — O09529 Supervision of elderly multigravida, unspecified trimester: Secondary | ICD-10-CM | POA: Insufficient documentation

## 2019-01-12 DIAGNOSIS — O09521 Supervision of elderly multigravida, first trimester: Secondary | ICD-10-CM

## 2019-01-12 DIAGNOSIS — O99351 Diseases of the nervous system complicating pregnancy, first trimester: Secondary | ICD-10-CM

## 2019-01-12 DIAGNOSIS — Z1151 Encounter for screening for human papillomavirus (HPV): Secondary | ICD-10-CM

## 2019-01-12 MED ORDER — ASPIRIN EC 81 MG PO TBEC: 81.0000 mg | DELAYED_RELEASE_TABLET | Freq: Every day | ORAL | 2 refills | Status: DC

## 2019-01-12 NOTE — Progress Notes (Signed)
History:   Anne Benjamin is a 38 y.o. G3P1011 at [redacted]w[redacted]d by LMP consistent with ultrasound here today being seen for her first obstetrical visit.  Her obstetrical history is significant for having an emergent LTCS during her last pregnancy due to having a severe seizure during labor.  She denies having any BP problems/eclampsia; she does have a seizure disorder. She is currently followed by Neurology and is on Lamictal, has not had an episode in a long time. Patient does intend to breast feed. Pregnancy history fully reviewed.  Patient reports no complaints.      HISTORY: OB History  Gravida Para Term Preterm AB Living  3 1 1  0 1 1  SAB TAB Ectopic Multiple Live Births  1 0 0 0 1    # Outcome Date GA Lbr Len/2nd Weight Sex Delivery Anes PTL Lv  3 Current           2 SAB 01/2018          1 Term      CS-LTranv  N LIV     Complications: Seizures During Labor    Last pap smear was done 2018 or 2019 and was normal  Past Medical History:  Diagnosis Date  . Convulsions/seizures ([place]) 09/03/2012  . Obesity   . Seizures ([place])    Past Surgical History:  Procedure Laterality Date  . CESAREAN SECTION     x 1   Family History  Problem Relation Age of Onset  . Hypertension Mother    Social History   Tobacco Use  . Smoking status: Never Smoker  . Smokeless tobacco: Never Used  Substance Use Topics  . Alcohol use: Yes    Comment: occasional  . Drug use: No   Allergies  Allergen Reactions  . Orange Oil Swelling  . Penicillins Nausea And Vomiting    Has patient had a PCN reaction causing immediate rash, facial/tongue/throat swelling, SOB or lightheadedness with hypotension: yes Has patient had a PCN reaction causing severe rash involving mucus membranes or skin necrosis: no Has patient had a PCN reaction that required hospitalization : unknown Has patient had a PCN reaction occurring within the last 10 years: no If all of the above answers are "NO", then may proceed with  Cephalosporin use.    Current Outpatient Medications on File Prior to Visit  Medication Sig Dispense Refill  . folic acid (FOLVITE) 1 MG tablet Take 1 tablet (1 mg total) by mouth daily. 90 tablet 3  . lamoTRIgine (LAMICTAL) 100 MG tablet Take 2 tablets twice a day 120 tablet 11  . ferrous sulfate 325 (65 FE) MG EC tablet Take 325 mg by mouth 3 (three) times daily with meals.    . Multiple Vitamin (MULTIVITAMIN) tablet Take 1 tablet by mouth daily.     No current facility-administered medications on file prior to visit.     Review of Systems Pertinent items noted in HPI and remainder of comprehensive ROS otherwise negative. Physical Exam:   Vitals:   01/12/19 0954  BP: 121/85  Pulse: 97  Weight: 222 lb (100.7 kg)   Fetal Heart Rate (bpm): 164 on Bedside Ultrasound for FHR check: Patient informed that the ultrasound is considered a limited obstetric ultrasound and is not intended to be a complete ultrasound exam.  Patient also informed that the ultrasound is not being completed with the intent of assessing for fetal or placental anomalies or any pelvic abnormalities.  Explained that the purpose of today's ultrasound is to  assess for fetal heart rate.  Patient acknowledges the purpose of the exam and the limitations of the study. Uterus:   11 week size  Pelvic Exam: Perineum: no hemorrhoids, normal perineum   Vulva: normal external genitalia, no lesions   Vagina:  normal mucosa, normal discharge   Cervix: no lesions and normal, pap smear done.    Adnexa: normal adnexa and no mass, fullness, tenderness   Bony Pelvis: average  System: General: well-developed, well-nourished female in no acute distress   Breasts:  normal appearance, no masses or tenderness bilaterally   Skin: normal coloration and turgor, no rashes   Neurologic: oriented, normal, negative, normal mood   Extremities: normal strength, tone, and muscle mass, ROM of all joints is normal   HEENT PERRLA, extraocular  movement intact and sclera clear, anicteric   Mouth/Teeth mucous membranes moist, pharynx normal without lesions and dental hygiene good   Neck supple and no masses   Cardiovascular: regular rate and rhythm   Respiratory:  no respiratory distress, normal breath sounds   Abdomen: soft, non-tender; bowel sounds normal; no masses,  no organomegaly     Assessment:    Pregnancy: [id number] Patient Active Problem List   Diagnosis Date Noted  . History of cesarean delivery affecting pregnancy 01/12/2019  . Supervision of high risk pregnancy, antepartum 01/12/2019  . Maternal morbid obesity, antepartum ([place]) 01/12/2019  . Multigravida of advanced maternal age in first trimester 01/12/2019  . Seizure disorder during pregnancy, antepartum ([place]) 01/12/2019  . Fibroids 03/16/2018  . Localization-related idiopathic epilepsy and epileptic syndromes with seizures of localized onset, not intractable, without status epilepticus ([place]) 03/03/2014  . Seizure ([place]) 02/24/2014     Plan:    1. Seizure disorder during pregnancy, antepartum ([place]) Continue Lamictal (currently 200 mg bid) and follow up with Neurology. On Folate 1 mg daily also.   2. Multigravida of advanced maternal age in first trimester Counseled about genetic and carrier screening, will get [place] and [place] today. Also counseled about first trimester ultrasound; she will be scheduled for NT scan.  - [place] MFM OB DETAIL +14 WK; Future - Genetic Screening - US MFM Fetal Nuchal Translucency; Future  3. Maternal morbid obesity, antepartum ([place]) Recommended 11-20 lb total weight gain, nutritionist referral done.  Baseline labs obtained. Prescribed ASA for preeclampsia prevention.  - aspirin EC 81 MG tablet; Take 1 tablet (81 mg total) by mouth daily. Take after 12 weeks for prevention of preeclampsia later in pregnancy  Dispense: 300 tablet; Refill: 2 - Comprehensive metabolic panel - Hemoglobin A1c - TSH - Protein / creatinine ratio, urine  - Referral to Nutrition and Diabetes Services  4. History of cesarean delivery affecting pregnancy Unsure if she wants RCS vs TOLAC, will discuss more later in pregnancy.  5. Supervision of high risk pregnancy, antepartum - Obstetric Panel, Including HIV - Culture, OB Urine - Cytology - PAP - Enroll Patient in Babyscripts - Babyscripts Schedule Optimization Initial labs drawn. Continue prenatal vitamins. Ultrasound discussed; fetal anatomic survey: ordered. Problem list reviewed and updated. The nature of [place] with multiple MDs and other Advanced Practice Providers was explained to patient; also emphasized that residents, students are part of our team. Routine obstetric precautions reviewed. Return in about 4 weeks (around 02/09/2019) for Virtual OB Visit.     Verita Schneiders, MD, [place] for [employer], [place]

## 2019-01-12 NOTE — Patient Instructions (Signed)
gFirst Trimester of Pregnancy The first trimester of pregnancy is from week 1 until the end of week 13 (months 1 through 3). A week after a sperm fertilizes an egg, the egg will implant on the wall of the uterus. This embryo will begin to develop into a baby. Genes from you and your partner will form the baby. The female genes will determine whether the baby will be a boy or a girl. At 6-8 weeks, the eyes and face will be formed, and the heartbeat can be seen on ultrasound. At the end of 12 weeks, all the baby's organs will be formed. Now that you are pregnant, you will want to do everything you can to have a healthy baby. Two of the most important things are to get good prenatal care and to follow your health care provider's instructions. Prenatal care is all the medical care you receive before the baby's birth. This care will help prevent, find, and treat any problems during the pregnancy and childbirth. Body changes during your first trimester Your body goes through many changes during pregnancy. The changes vary from woman to woman.  You may gain or lose a couple of pounds at first.  You may feel sick to your stomach (nauseous) and you may throw up (vomit). If the vomiting is uncontrollable, call your health care provider.  You may tire easily.  You may develop headaches that can be relieved by medicines. All medicines should be approved by your health care provider.  You may urinate more often. Painful urination may mean you have a bladder infection.  You may develop heartburn as a result of your pregnancy.  You may develop constipation because certain hormones are causing the muscles that push stool through your intestines to slow down.  You may develop hemorrhoids or swollen veins (varicose veins).  Your breasts may begin to grow larger and become tender. Your nipples may stick out more, and the tissue that surrounds them (areola) may become darker.  Your gums may bleed and may be  sensitive to brushing and flossing.  Dark spots or blotches (chloasma, mask of pregnancy) may develop on your face. This will likely fade after the baby is born.  Your menstrual periods will stop.  You may have a loss of appetite.  You may develop cravings for certain kinds of food.  You may have changes in your emotions from day to day, such as being excited to be pregnant or being concerned that something may go wrong with the pregnancy and baby.  You may have more vivid and strange dreams.  You may have changes in your hair. These can include thickening of your hair, rapid growth, and changes in texture. Some women also have hair loss during or after pregnancy, or hair that feels dry or thin. Your hair will most likely return to normal after your baby is born. What to expect at prenatal visits During a routine prenatal visit:  You will be weighed to make sure you and the baby are growing normally.  Your blood pressure will be taken.  Your abdomen will be measured to track your baby's growth.  The fetal heartbeat will be listened to between weeks 10 and 14 of your pregnancy.  Test results from any previous visits will be discussed. Your health care provider may ask you:  How you are feeling.  If you are feeling the baby move.  If you have had any abnormal symptoms, such as leaking fluid, bleeding, severe headaches, or abdominal  cramping.  If you are using any tobacco products, including cigarettes, chewing tobacco, and electronic cigarettes.  If you have any questions. Other tests that may be performed during your first trimester include:  Blood tests to find your blood type and to check for the presence of any previous infections. The tests will also be used to check for low iron levels (anemia) and protein on red blood cells (Rh antibodies). Depending on your risk factors, or if you previously had diabetes during pregnancy, you may have tests to check for high blood sugar  that affects pregnant women (gestational diabetes).  Urine tests to check for infections, diabetes, or protein in the urine.  An ultrasound to confirm the proper growth and development of the baby.  Fetal screens for spinal cord problems (spina bifida) and Down syndrome.  HIV (human immunodeficiency virus) testing. Routine prenatal testing includes screening for HIV, unless you choose not to have this test.  You may need other tests to make sure you and the baby are doing well. Follow these instructions at home: Medicines  Follow your health care provider's instructions regarding medicine use. Specific medicines may be either safe or unsafe to take during pregnancy.  Take a prenatal vitamin that contains at least 600 micrograms (mcg) of folic acid.  If you develop constipation, try taking a stool softener if your health care provider approves. Eating and drinking   Eat a balanced diet that includes fresh fruits and vegetables, whole grains, good sources of protein such as meat, eggs, or tofu, and low-fat dairy. Your health care provider will help you determine the amount of weight gain that is right for you.  Avoid raw meat and uncooked cheese. These carry germs that can cause birth defects in the baby.  Eating four or five small meals rather than three large meals a day may help relieve nausea and vomiting. If you start to feel nauseous, eating a few soda crackers can be helpful. Drinking liquids between meals, instead of during meals, also seems to help ease nausea and vomiting.  Limit foods that are high in fat and processed sugars, such as fried and sweet foods.  To prevent constipation: ? Eat foods that are high in fiber, such as fresh fruits and vegetables, whole grains, and beans. ? Drink enough fluid to keep your urine clear or pale yellow. Activity  Exercise only as directed by your health care provider. Most women can continue their usual exercise routine during  pregnancy. Try to exercise for 30 minutes at least 5 days a week. Exercising will help you: ? Control your weight. ? Stay in shape. ? Be prepared for labor and delivery.  Experiencing pain or cramping in the lower abdomen or lower back is a good sign that you should stop exercising. Check with your health care provider before continuing with normal exercises.  Try to avoid standing for long periods of time. Move your legs often if you must stand in one place for a long time.  Avoid heavy lifting.  Wear low-heeled shoes and practice good posture.  You may continue to have sex unless your health care provider tells you not to. Relieving pain and discomfort  Wear a good support bra to relieve breast tenderness.  Take warm sitz baths to soothe any pain or discomfort caused by hemorrhoids. Use hemorrhoid cream if your health care provider approves.  Rest with your legs elevated if you have leg cramps or low back pain.  If you develop varicose veins in   your legs, wear support hose. Elevate your feet for 15 minutes, 3-4 times a day. Limit salt in your diet. Prenatal care  Schedule your prenatal visits by the twelfth week of pregnancy. They are usually scheduled monthly at first, then more often in the last 2 months before delivery.  Write down your questions. Take them to your prenatal visits.  Keep all your prenatal visits as told by your health care provider. This is important. Safety  Wear your seat belt at all times when driving.  Make a list of emergency phone numbers, including numbers for family, friends, the hospital, and police and fire departments. General instructions  Ask your health care provider for a referral to a local prenatal education class. Begin classes no later than the beginning of month 6 of your pregnancy.  Ask for help if you have counseling or nutritional needs during pregnancy. Your health care provider can offer advice or refer you to specialists for help  with various needs.  Do not use hot tubs, steam rooms, or saunas.  Do not douche or use tampons or scented sanitary pads.  Do not cross your legs for long periods of time.  Avoid cat litter boxes and soil used by cats. These carry germs that can cause birth defects in the baby and possibly loss of the fetus by miscarriage or stillbirth.  Avoid all smoking, herbs, alcohol, and medicines not prescribed by your health care provider. Chemicals in these products affect the formation and growth of the baby.  Do not use any products that contain nicotine or tobacco, such as cigarettes and e-cigarettes. If you need help quitting, ask your health care provider. You may receive counseling support and other resources to help you quit.  Schedule a dentist appointment. At home, brush your teeth with a soft toothbrush and be gentle when you floss. Contact a health care provider if:  You have dizziness.  You have mild pelvic cramps, pelvic pressure, or nagging pain in the abdominal area.  You have persistent nausea, vomiting, or diarrhea.  You have a bad smelling vaginal discharge.  You have pain when you urinate.  You notice increased swelling in your face, hands, legs, or ankles.  You are exposed to fifth disease or chickenpox.  You are exposed to German measles (rubella) and have never had it. Get help right away if:  You have a fever.  You are leaking fluid from your vagina.  You have spotting or bleeding from your vagina.  You have severe abdominal cramping or pain.  You have rapid weight gain or loss.  You vomit blood or material that looks like coffee grounds.  You develop a severe headache.  You have shortness of breath.  You have any kind of trauma, such as from a fall or a car accident. Summary  The first trimester of pregnancy is from week 1 until the end of week 13 (months 1 through 3).  Your body goes through many changes during pregnancy. The changes vary from  woman to woman.  You will have routine prenatal visits. During those visits, your health care provider will examine you, discuss any test results you may have, and talk with you about how you are feeling. This information is not intended to replace advice given to you by your health care provider. Make sure you discuss any questions you have with your health care provider. Document Released: 04/16/2001 Document Revised: 04/04/2017 Document Reviewed: 04/03/2016 Elsevier Patient Education  2020 Elsevier Inc.  

## 2019-01-12 NOTE — Progress Notes (Signed)
DATING AND VIABILITY SONOGRAM   Tedi Denkins is a 38 y.o. year old G37P1011 with LMP Patient's last menstrual period was 10/26/2018 (exact date). which would correlate to  [redacted]w[redacted]d weeks gestation.  She has regular menstrual cycles.   She is here today for a confirmatory initial sonogram.    GESTATION: SINGLETON yes     FETAL ACTIVITY:          Heart rate   164          The fetus is active.    GESTATIONAL AGE AND  BIOMETRICS:  Gestational criteria: Estimated Date of Delivery: 08/02/19 by LMP now at [redacted]w[redacted]d  Previous Scans:0  GESTATIONAL SAC            mm        CROWN RUMP LENGTH        4.30 mm        11.1 weeks                                                   AVERAGE EGA(BY THIS SCAN):  11.1 weeks  WORKING EDD( LMP ):  08/02/2019     TECHNICIAN COMMENTS: Patient informed that the ultrasound is considered a limited obstetric ultrasound and is not intended to be a complete ultrasound exam. Patient also informed that the ultrasound is not being completed with the intent of assessing for fetal or placental anomalies or any pelvic abnormalities. Explained that the purpose of today's ultrasound is to assess for fetal heart rate. Patient acknowledges the purpose of the exam and the limitations of the study.      A copy of this report including all images has been saved and backed up to a second source for retrieval if needed. All measures and details of the anatomical scan, placentation, fluid volume and pelvic anatomy are contained in that report.  Crosby Oyster 01/12/2019 10:04 AM

## 2019-01-13 ENCOUNTER — Encounter: Payer: Self-pay | Admitting: Obstetrics & Gynecology

## 2019-01-13 LAB — COMPREHENSIVE METABOLIC PANEL
ALT: 15 IU/L (ref 0–32)
AST: 14 IU/L (ref 0–40)
Albumin/Globulin Ratio: 1.9 (ref 1.2–2.2)
Albumin: 4.1 g/dL (ref 3.8–4.8)
Alkaline Phosphatase: 91 IU/L (ref 39–117)
BUN/Creatinine Ratio: 13 (ref 9–23)
BUN: 9 mg/dL (ref 6–24)
Bilirubin Total: <0.2 mg/dL (ref 0.0–1.2)
CO2: 20 mmol/L (ref 20–29)
Calcium: 9.6 mg/dL (ref 8.7–10.2)
Chloride: 98 mmol/L (ref 96–106)
Creatinine, Ser: 0.71 mg/dL (ref 0.57–1.00)
GFR calc Af Amer: 123 mL/min/{1.73_m2} (ref >59)
GFR calc non Af Amer: 107 mL/min/{1.73_m2} (ref >59)
Globulin, Total: 2.2 g/dL (ref 1.5–4.5)
Glucose: 94 mg/dL (ref 65–99)
Potassium: 4.1 mmol/L (ref 3.5–5.2)
Sodium: 135 mmol/L (ref 134–144)
Total Protein: 6.3 g/dL (ref 6.0–8.5)

## 2019-01-13 LAB — OBSTETRIC PANEL, INCLUDING HIV
Antibody Screen: NEGATIVE
Basophils Absolute: 0 10*3/uL (ref 0.0–0.2)
Basos: 0 %
EOS (ABSOLUTE): 0.1 10*3/uL (ref 0.0–0.4)
Eos: 1 %
HIV Screen 4th Generation wRfx: NONREACTIVE
Hematocrit: 43.8 % (ref 34.0–46.6)
Hemoglobin: 14.2 g/dL (ref 11.1–15.9)
Hepatitis B Surface Ag: NEGATIVE
Immature Grans (Abs): 0.1 10*3/uL (ref 0.0–0.1)
Immature Granulocytes: 1 %
Lymphocytes Absolute: 2 10*3/uL (ref 0.7–3.1)
Lymphs: 19 %
MCH: 26.7 pg (ref 26.6–33.0)
MCHC: 32.4 g/dL (ref 31.5–35.7)
MCV: 82 fL (ref 79–97)
Monocytes Absolute: 0.8 10*3/uL (ref 0.1–0.9)
Monocytes: 7 %
Neutrophils Absolute: 7.8 10*3/uL — ABNORMAL HIGH (ref 1.4–7.0)
Neutrophils: 72 %
Platelets: 337 10*3/uL (ref 150–450)
RBC: 5.32 x10E6/uL — ABNORMAL HIGH (ref 3.77–5.28)
RDW: 14.1 % (ref 11.7–15.4)
RPR Ser Ql: NONREACTIVE
Rh Factor: POSITIVE
Rubella Antibodies, IGG: 6.68 index (ref >0.99)
WBC: 10.8 10*3/uL (ref 3.4–10.8)

## 2019-01-13 LAB — PROTEIN / CREATININE RATIO, URINE
Creatinine, Urine: 138.7 mg/dL
Protein, Ur: 9.7 mg/dL
Protein/Creat Ratio: 70 mg/g creat (ref 0–200)

## 2019-01-13 LAB — CYTOLOGY - PAP
Chlamydia: NEGATIVE
Diagnosis: NEGATIVE
HPV: NOT DETECTED
Neisseria Gonorrhea: NEGATIVE
Trichomonas: NEGATIVE

## 2019-01-13 LAB — HEMOGLOBIN A1C
Est. average glucose Bld gHb Est-mCnc: 103 mg/dL
Hgb A1c MFr Bld: 5.2 % (ref 4.8–5.6)

## 2019-01-13 LAB — TSH: TSH: 0.272 u[IU]/mL — ABNORMAL LOW (ref 0.450–4.500)

## 2019-01-14 LAB — URINE CULTURE, OB REFLEX

## 2019-01-14 LAB — CULTURE, OB URINE

## 2019-01-15 ENCOUNTER — Other Ambulatory Visit: Payer: Self-pay | Admitting: *Deleted

## 2019-01-15 MED ORDER — BLOOD PRESSURE KIT: 1.0000 | PACK | 0 refills | Status: DC

## 2019-01-18 ENCOUNTER — Telehealth: Payer: Self-pay | Admitting: Radiology

## 2019-01-18 ENCOUNTER — Encounter: Payer: Self-pay | Admitting: Radiology

## 2019-01-18 LAB — T3, FREE: T3, Free: 3.9 pg/mL (ref 2.0–4.4)

## 2019-01-18 LAB — T4, FREE: Free T4: 1.17 ng/dL (ref 0.82–1.77)

## 2019-01-18 LAB — SPECIMEN STATUS REPORT

## 2019-01-18 NOTE — Telephone Encounter (Signed)
Patient informed of Panorama results

## 2019-01-19 ENCOUNTER — Other Ambulatory Visit (HOSPITAL_COMMUNITY): Payer: Self-pay

## 2019-01-25 ENCOUNTER — Encounter (HOSPITAL_COMMUNITY): Payer: Self-pay

## 2019-01-25 ENCOUNTER — Other Ambulatory Visit: Payer: Self-pay

## 2019-01-25 ENCOUNTER — Ambulatory Visit (HOSPITAL_COMMUNITY): Payer: BC Managed Care – PPO | Admitting: *Deleted

## 2019-01-25 ENCOUNTER — Ambulatory Visit (HOSPITAL_COMMUNITY): Payer: BC Managed Care – PPO

## 2019-01-25 ENCOUNTER — Ambulatory Visit (HOSPITAL_COMMUNITY)
Admission: RE | Admit: 2019-01-25 | Discharge: 2019-01-25 | Disposition: A | Discharge status: Home / Self Care | Payer: BC Managed Care – PPO | Source: Ambulatory Visit | Attending: Obstetrics & Gynecology | Admitting: Obstetrics & Gynecology

## 2019-01-25 VITALS — BP 132/92 | HR 101 | Temp 98.2°F

## 2019-01-25 DIAGNOSIS — Z3A13 13 weeks gestation of pregnancy: Secondary | ICD-10-CM

## 2019-01-25 DIAGNOSIS — O99211 Obesity complicating pregnancy, first trimester: Secondary | ICD-10-CM | POA: Diagnosis not present

## 2019-01-25 DIAGNOSIS — O09521 Supervision of elderly multigravida, first trimester: Secondary | ICD-10-CM | POA: Diagnosis not present

## 2019-01-25 DIAGNOSIS — O34219 Maternal care for unspecified type scar from previous cesarean delivery: Secondary | ICD-10-CM

## 2019-01-26 ENCOUNTER — Telehealth: Payer: Self-pay

## 2019-01-26 DIAGNOSIS — D563 Thalassemia minor: Secondary | ICD-10-CM

## 2019-01-26 NOTE — Telephone Encounter (Signed)
Received patient horizon results back. 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

## 2019-01-27 ENCOUNTER — Encounter: Payer: Self-pay | Admitting: Obstetrics & Gynecology

## 2019-02-03 ENCOUNTER — Other Ambulatory Visit: Payer: Self-pay

## 2019-02-03 ENCOUNTER — Encounter: Payer: Self-pay | Admitting: Registered"

## 2019-02-03 ENCOUNTER — Encounter: Payer: BC Managed Care – PPO | Attending: Obstetrics & Gynecology | Admitting: Registered"

## 2019-02-03 DIAGNOSIS — Z713 Dietary counseling and surveillance: Secondary | ICD-10-CM | POA: Diagnosis not present

## 2019-02-03 NOTE — Patient Instructions (Addendum)
Continue drinking plenty of water Consider the tips in the Iron handout such as avoiding taking the supplement with foods/other vitamins with calcium. Taking with foods rich in vitamin C will help absorption. Continue with exercise as tolerated. 3-5 times per week. Adding in some stretching is a good idea. Consider adding more vegetables in your diet. Most fish is safe 2x week. See list for those to avoid.  Consider trying ginger herbal tea. Use your hunger and fullness cues for know how much to eat.

## 2019-02-03 NOTE — Progress Notes (Signed)
Medical Nutrition Therapy:  Appt start time: 1110 end time:  [id number].   Assessment:  Primary concerns today: Patient states d/t her age she is aware of increased risk of issues. Pt wants to know what foods are safe and general eating recommendation for pregnancy.   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 Pt states with having kids home d/t COVID it has been an adjustment, but doing pretty well overall.  Sleep: 11 p - 8 am Energy: 8/10 ("good")  MEDICATIONS: reviewed: Including - iron, folate, pre-natal.   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 Doesn't avoid lemon    24-hr recall:  B (9 AM): 3 [place] bacon, yogurt (sometimes with 1 egg) OR cheerios  Snk ( AM): almonds, chips, trail mix  L (2:30 PM): sandwich, left overs Snk ( PM): yogurt (yoplait or [place]) D (4:30-5 PM): chicken sand OR chicken, rice & veggies Snk ( PM): none Beverages: water, juice  Usual physical activity: walking 3x for 30 min outside  Progress Towards Goal(s):  New goals.   Nutritional Diagnosis:  NI-5.8.5 Inadeqate fiber intake As related to limited vegetable intake.  As evidenced by dietary recall.    Intervention:  Nutrition Education. Discussed importance of balanced diet in Pregnancy. Reviewed safety precautions with fish and raw foods  Plan: Continue drinking plenty of water Consider the tips in the Iron handout such as avoiding taking the supplement with foods/other vitamins with calcium. Taking with foods rich in vitamin C will help absorption. Continue with exercise as tolerated. 3-5 times per week. Adding in some stretching is a good idea. Consider adding more vegetables in your diet. Most fish is safe 2x week. See list for those to avoid.  Consider trying ginger herbal tea. Use your hunger and fullness cues for know how much to eat.  Handouts given during visit include:  Choose MyPlate for Moms: Eating for a healthy baby  Iron handout (AND)  Barriers to  learning/adherence to lifestyle change: none  Demonstrated degree of understanding via:  Teach Back   Monitoring/Evaluation:  Dietary intake, exercise, and body weight prn.

## 2019-02-09 ENCOUNTER — Other Ambulatory Visit: Payer: Self-pay

## 2019-02-09 ENCOUNTER — Telehealth (INDEPENDENT_AMBULATORY_CARE_PROVIDER_SITE_OTHER): Payer: BC Managed Care – PPO | Admitting: Family Medicine

## 2019-02-09 ENCOUNTER — Encounter: Payer: Self-pay | Admitting: Family Medicine

## 2019-02-09 DIAGNOSIS — O34219 Maternal care for unspecified type scar from previous cesarean delivery: Secondary | ICD-10-CM

## 2019-02-09 DIAGNOSIS — O99352 Diseases of the nervous system complicating pregnancy, second trimester: Secondary | ICD-10-CM

## 2019-02-09 DIAGNOSIS — Z3A15 15 weeks gestation of pregnancy: Secondary | ICD-10-CM

## 2019-02-09 DIAGNOSIS — O09522 Supervision of elderly multigravida, second trimester: Secondary | ICD-10-CM

## 2019-02-09 DIAGNOSIS — O99350 Diseases of the nervous system complicating pregnancy, unspecified trimester: Secondary | ICD-10-CM

## 2019-02-09 DIAGNOSIS — G40909 Epilepsy, unspecified, not intractable, without status epilepticus: Secondary | ICD-10-CM

## 2019-02-09 DIAGNOSIS — O0992 Supervision of high risk pregnancy, unspecified, second trimester: Secondary | ICD-10-CM

## 2019-02-09 DIAGNOSIS — O0990 Supervision of high risk pregnancy, unspecified, unspecified trimester: Secondary | ICD-10-CM

## 2019-02-09 DIAGNOSIS — D563 Thalassemia minor: Secondary | ICD-10-CM | POA: Insufficient documentation

## 2019-02-09 NOTE — Progress Notes (Signed)
I connected with  Anne Benjamin on 02/09/19 at 11:00 AM EDT by telephone and verified that I am speaking with the correct person using two identifiers.   I discussed the limitations, risks, security and privacy concerns of performing an evaluation and management service by telephone and the availability of in person appointments. I also discussed with the patient that there may be a patient responsible charge related to this service. The patient expressed understanding and agreed to proceed.  Crosby Oyster, RN 02/09/2019  11:02 AM

## 2019-02-09 NOTE — Patient Instructions (Signed)

## 2019-02-09 NOTE — Progress Notes (Signed)
TELEHEALTH OBSTETRICS PRENATAL VIRTUAL VIDEO VISIT ENCOUNTER NOTE  Provider location: [facility] [place] [facility]   I connected with Anne Benjamin on 02/09/19 at 11:00 AM EDT by MyChart Video Encounter at home and verified that I am speaking with the correct person using two identifiers.   I discussed the limitations, risks, security and privacy concerns of performing an evaluation and management service virtually and the availability of in person appointments. I also discussed with the patient that there may be a patient responsible charge related to this service. The patient expressed understanding and agreed to proceed. Subjective:  Anne Benjamin is a 38 y.o. G3P1011 at [redacted]w[redacted]d being seen today for ongoing prenatal care.  She is currently monitored for the following issues for this high-risk pregnancy and has Seizure ([place]); Localization-related idiopathic epilepsy and epileptic syndromes with seizures of localized onset, not intractable, without status epilepticus ([place]); Fibroids; History of cesarean delivery affecting pregnancy; Supervision of high risk pregnancy, antepartum; Maternal morbid obesity, antepartum ([place]); Multigravida of advanced maternal age; and Seizure disorder during pregnancy, antepartum ([place]) on their problem list.  Patient reports nausea.  Contractions: Not present. Vag. Bleeding: None.   . Denies any leaking of fluid.   The following portions of the patient's history were reviewed and updated as appropriate: allergies, current medications, past family history, past medical history, past social history, past surgical history and problem list.   Objective:  There were no vitals filed for this visit.  Fetal Status:           General:  Alert, oriented and cooperative. Patient is in no acute distress.  Respiratory: Normal respiratory effort, no problems with respiration noted  Mental Status: Normal mood and affect. Normal behavior. Normal judgment and thought  content.  Rest of physical exam deferred due to type of encounter  Imaging: [place] Mfm Fetal Nuchal Translucency  Result Date: 01/25/2019 ----------------------------------------------------------------------  OBSTETRICS REPORT                       (Signed Final 01/25/2019 03:11 pm) ---------------------------------------------------------------------- Patient Info  ID #:       [id number]                          D.O.B.:  11/15/1980 (38 yrs)  Name:       Anne Benjamin                      Visit Date: 01/25/2019 12:03 pm ---------------------------------------------------------------------- Performed By  Performed By:     Georgie Chard        Ref. Address:     [address]  [place], [place]  Attending:        Johnell Comings MD         [facility]  Referred By:      Osborne Oman MD ---------------------------------------------------------------------- Orders   #  Description                          Code         Ordered By   1  [place] MFM FETAL NUCHAL                  [phone]      UGONNA ANYANWU      TRANSLUCENCY  ----------------------------------------------------------------------   #  Order #                    Accession #                 Episode #   1  [id number]                  [id number]                  [id number]  ---------------------------------------------------------------------- Indications   Maternal morbid obesity (BMI 60)               O99.210 E66.01   Previous cesarean delivery, antepartum         O34.219   [redacted] weeks gestation of pregnancy                Z3A.13  ---------------------------------------------------------------------- Vital Signs  Weight (lb): 222                                Height:        4'11"  BMI:         44.83 ---------------------------------------------------------------------- Fetal Evaluation  Num Of Fetuses:         1  Fetal Heart Rate(bpm):  157  Cardiac Activity:       Observed  Presentation:           Variable  Placenta:               Anterior ---------------------------------------------------------------------- Biometry  CRL:      69.4  mm     G. Age:  13w 0d                  EDD:   08/02/19  NT:       1.24  mm ---------------------------------------------------------------------- OB History  Gravidity:    3         Term:   1         SAB:   1  Living:       1 ---------------------------------------------------------------------- Gestational Age  LMP:           13w 0d        Date:  10/26/18                 EDD:   08/02/19  Best:          [place] 0d     Det. By:  Previous Ultrasound      EDD:   08/02/19                                      (01/12/19) ---------------------------------------------------------------------- Anatomy  Diaphragm:             Appears normal         Bladder:                Appears normal  Stomach:               Appears normal, left                         sided ---------------------------------------------------------------------- Comments  This patient was seen for a nuchal translucency  measurement and first trimester ultrasound due to advanced  maternal age and maternal obesity.  The patient reports a  history of a seizure disorder that is currently treated with  Lamictal.  The patient reports that she already had a cell free DNA test  earlier in her pregnancy which indicated a low risk for trisomy  28, 18, and 13.  A female fetus is predicted.  The crown-rump length measured today is consistent with her  gestational age.  The nuchal translucency measurement was  1.2 mm.  This is a normal measurement.  Multiple fibroids were noted throughout her uterus today.  The  increased risk of possible fetal growth issues later in her  pregnancy and  also with maternal pain issues associated  with fibroids in pregnancy was discussed today.  The patient should have a fetal anatomy scan performed at  around 19 weeks.  Due to the fibroid uterus, she should then  be followed with serial growth ultrasounds. ----------------------------------------------------------------------                   Johnell Comings, MD Electronically Signed Final Report   01/25/2019 03:11 pm ----------------------------------------------------------------------   Assessment and Plan:  Pregnancy: [id number] at [redacted]w[redacted]d 1. Seizure disorder during pregnancy, antepartum ([place]) No seizures, on Keppra  2. History of cesarean delivery affecting pregnancy For arrest at 4 cm and NR FHR  3. Multigravida of advanced maternal age in second trimester Normal NIPT--has alpha thal carrier, to see [facility] genetics and have FOB tested  4. Supervision of high risk pregnancy, antepartum For AFP with next visit Has anatomy u/s scheduled  General obstetric precautions including but not limited to vaginal bleeding, contractions, leaking of fluid and fetal movement were reviewed in detail with the patient. I discussed the assessment and treatment plan with the patient. The patient was provided an opportunity to ask questions and all were answered. The patient agreed with the plan and demonstrated an understanding of the instructions. The patient was advised to call back or seek  an in-person office evaluation/go to [facility] at [facility] for any urgent or concerning symptoms. Please refer to After Visit Summary for other counseling recommendations.   I provided 22 minutes of face-to-face time during this encounter.  Return in 4 weeks (on 03/09/2019) for in person.  Future Appointments  Date Time Provider [place]  03/08/2019  2:10 PM [place] [facility]  03/08/2019  2:15 PM [place] 4 WH-[facility]US [facility]  06/25/2019 11:30 AM Cameron Sprang, MD [facility] None    [place] Jude, [place] for [facility], [place]

## 2019-02-11 ENCOUNTER — Encounter: Payer: Self-pay | Admitting: *Deleted

## 2019-02-16 ENCOUNTER — Ambulatory Visit: Payer: Self-pay | Admitting: Dietician

## 2019-03-08 ENCOUNTER — Encounter (HOSPITAL_COMMUNITY): Payer: Self-pay | Admitting: *Deleted

## 2019-03-08 ENCOUNTER — Ambulatory Visit (HOSPITAL_COMMUNITY): Payer: BC Managed Care – PPO | Admitting: *Deleted

## 2019-03-08 ENCOUNTER — Ambulatory Visit (HOSPITAL_COMMUNITY)
Admission: RE | Admit: 2019-03-08 | Discharge: 2019-03-08 | Disposition: A | Discharge status: Home / Self Care | Payer: BC Managed Care – PPO | Source: Ambulatory Visit | Attending: Obstetrics and Gynecology | Admitting: Obstetrics and Gynecology

## 2019-03-08 ENCOUNTER — Other Ambulatory Visit: Payer: Self-pay

## 2019-03-08 VITALS — BP 121/88 | HR 108 | Temp 98.6°F

## 2019-03-08 DIAGNOSIS — O09529 Supervision of elderly multigravida, unspecified trimester: Secondary | ICD-10-CM

## 2019-03-08 DIAGNOSIS — G40909 Epilepsy, unspecified, not intractable, without status epilepticus: Secondary | ICD-10-CM

## 2019-03-08 DIAGNOSIS — O09522 Supervision of elderly multigravida, second trimester: Secondary | ICD-10-CM

## 2019-03-08 DIAGNOSIS — O99212 Obesity complicating pregnancy, second trimester: Secondary | ICD-10-CM

## 2019-03-08 DIAGNOSIS — O09521 Supervision of elderly multigravida, first trimester: Secondary | ICD-10-CM | POA: Diagnosis not present

## 2019-03-08 DIAGNOSIS — O3412 Maternal care for benign tumor of corpus uteri, second trimester: Secondary | ICD-10-CM

## 2019-03-08 DIAGNOSIS — Z3A19 19 weeks gestation of pregnancy: Secondary | ICD-10-CM

## 2019-03-08 DIAGNOSIS — O99352 Diseases of the nervous system complicating pregnancy, second trimester: Secondary | ICD-10-CM

## 2019-03-08 DIAGNOSIS — O34219 Maternal care for unspecified type scar from previous cesarean delivery: Secondary | ICD-10-CM

## 2019-03-09 ENCOUNTER — Other Ambulatory Visit (HOSPITAL_COMMUNITY): Payer: Self-pay | Admitting: *Deleted

## 2019-03-09 DIAGNOSIS — O09522 Supervision of elderly multigravida, second trimester: Secondary | ICD-10-CM

## 2019-04-05 ENCOUNTER — Encounter (HOSPITAL_COMMUNITY): Payer: Self-pay | Admitting: *Deleted

## 2019-04-05 ENCOUNTER — Ambulatory Visit (HOSPITAL_COMMUNITY): Payer: BC Managed Care – PPO | Admitting: *Deleted

## 2019-04-05 ENCOUNTER — Other Ambulatory Visit: Payer: Self-pay

## 2019-04-05 ENCOUNTER — Ambulatory Visit (HOSPITAL_COMMUNITY)
Admission: RE | Admit: 2019-04-05 | Discharge: 2019-04-05 | Disposition: A | Discharge status: Home / Self Care | Payer: BC Managed Care – PPO | Source: Ambulatory Visit | Attending: Obstetrics and Gynecology | Admitting: Obstetrics and Gynecology

## 2019-04-05 VITALS — BP 138/73 | HR 96 | Temp 97.0°F

## 2019-04-05 DIAGNOSIS — O34219 Maternal care for unspecified type scar from previous cesarean delivery: Secondary | ICD-10-CM

## 2019-04-05 DIAGNOSIS — G40909 Epilepsy, unspecified, not intractable, without status epilepticus: Secondary | ICD-10-CM

## 2019-04-05 DIAGNOSIS — O09529 Supervision of elderly multigravida, unspecified trimester: Secondary | ICD-10-CM | POA: Insufficient documentation

## 2019-04-05 DIAGNOSIS — O09522 Supervision of elderly multigravida, second trimester: Secondary | ICD-10-CM | POA: Diagnosis not present

## 2019-04-05 DIAGNOSIS — O99352 Diseases of the nervous system complicating pregnancy, second trimester: Secondary | ICD-10-CM

## 2019-04-05 DIAGNOSIS — O99212 Obesity complicating pregnancy, second trimester: Secondary | ICD-10-CM | POA: Diagnosis not present

## 2019-04-05 DIAGNOSIS — O3412 Maternal care for benign tumor of corpus uteri, second trimester: Secondary | ICD-10-CM

## 2019-04-05 DIAGNOSIS — Z362 Encounter for other antenatal screening follow-up: Secondary | ICD-10-CM

## 2019-04-05 DIAGNOSIS — Z3A23 23 weeks gestation of pregnancy: Secondary | ICD-10-CM

## 2019-04-06 ENCOUNTER — Other Ambulatory Visit (HOSPITAL_COMMUNITY): Payer: Self-pay | Admitting: *Deleted

## 2019-04-06 DIAGNOSIS — O09523 Supervision of elderly multigravida, third trimester: Secondary | ICD-10-CM

## 2019-04-19 ENCOUNTER — Other Ambulatory Visit: Payer: Self-pay | Admitting: Neurology

## 2019-05-10 ENCOUNTER — Ambulatory Visit (HOSPITAL_COMMUNITY): Payer: BC Managed Care – PPO | Admitting: *Deleted

## 2019-05-10 ENCOUNTER — Encounter (HOSPITAL_COMMUNITY): Payer: Self-pay | Admitting: *Deleted

## 2019-05-10 ENCOUNTER — Other Ambulatory Visit: Payer: Self-pay

## 2019-05-10 ENCOUNTER — Ambulatory Visit (HOSPITAL_COMMUNITY)
Admission: RE | Admit: 2019-05-10 | Discharge: 2019-05-10 | Disposition: A | Discharge status: Home / Self Care | Payer: BC Managed Care – PPO | Source: Ambulatory Visit | Attending: Obstetrics and Gynecology | Admitting: Obstetrics and Gynecology

## 2019-05-10 ENCOUNTER — Other Ambulatory Visit (HOSPITAL_COMMUNITY): Payer: Self-pay | Admitting: *Deleted

## 2019-05-10 VITALS — BP 146/72 | HR 110 | Temp 97.1°F

## 2019-05-10 DIAGNOSIS — G40909 Epilepsy, unspecified, not intractable, without status epilepticus: Secondary | ICD-10-CM

## 2019-05-10 DIAGNOSIS — Z362 Encounter for other antenatal screening follow-up: Secondary | ICD-10-CM | POA: Diagnosis not present

## 2019-05-10 DIAGNOSIS — O99353 Diseases of the nervous system complicating pregnancy, third trimester: Secondary | ICD-10-CM

## 2019-05-10 DIAGNOSIS — O99213 Obesity complicating pregnancy, third trimester: Secondary | ICD-10-CM

## 2019-05-10 DIAGNOSIS — O09529 Supervision of elderly multigravida, unspecified trimester: Secondary | ICD-10-CM | POA: Diagnosis present

## 2019-05-10 DIAGNOSIS — O34219 Maternal care for unspecified type scar from previous cesarean delivery: Secondary | ICD-10-CM

## 2019-05-10 DIAGNOSIS — Z3A28 28 weeks gestation of pregnancy: Secondary | ICD-10-CM

## 2019-05-10 DIAGNOSIS — O3413 Maternal care for benign tumor of corpus uteri, third trimester: Secondary | ICD-10-CM

## 2019-05-10 DIAGNOSIS — O09523 Supervision of elderly multigravida, third trimester: Secondary | ICD-10-CM | POA: Insufficient documentation

## 2019-06-04 ENCOUNTER — Ambulatory Visit: Payer: Managed Care, Other (non HMO) | Admitting: Neurology

## 2019-06-07 ENCOUNTER — Ambulatory Visit (HOSPITAL_COMMUNITY)
Admission: RE | Admit: 2019-06-07 | Discharge: 2019-06-07 | Disposition: A | Discharge status: Home / Self Care | Payer: BC Managed Care – PPO | Source: Ambulatory Visit | Attending: Obstetrics and Gynecology | Admitting: Obstetrics and Gynecology

## 2019-06-07 ENCOUNTER — Encounter (HOSPITAL_COMMUNITY): Payer: Self-pay | Admitting: *Deleted

## 2019-06-07 ENCOUNTER — Other Ambulatory Visit: Payer: Self-pay

## 2019-06-07 ENCOUNTER — Ambulatory Visit (HOSPITAL_COMMUNITY): Payer: BC Managed Care – PPO | Admitting: *Deleted

## 2019-06-07 VITALS — BP 129/84 | HR 98 | Temp 97.5°F

## 2019-06-07 DIAGNOSIS — Z362 Encounter for other antenatal screening follow-up: Secondary | ICD-10-CM | POA: Diagnosis not present

## 2019-06-07 DIAGNOSIS — O09529 Supervision of elderly multigravida, unspecified trimester: Secondary | ICD-10-CM | POA: Insufficient documentation

## 2019-06-07 DIAGNOSIS — G40909 Epilepsy, unspecified, not intractable, without status epilepticus: Secondary | ICD-10-CM | POA: Diagnosis present

## 2019-06-07 DIAGNOSIS — O99213 Obesity complicating pregnancy, third trimester: Secondary | ICD-10-CM

## 2019-06-07 DIAGNOSIS — O3413 Maternal care for benign tumor of corpus uteri, third trimester: Secondary | ICD-10-CM

## 2019-06-07 DIAGNOSIS — O99353 Diseases of the nervous system complicating pregnancy, third trimester: Secondary | ICD-10-CM

## 2019-06-07 DIAGNOSIS — Z3A32 32 weeks gestation of pregnancy: Secondary | ICD-10-CM

## 2019-06-08 ENCOUNTER — Other Ambulatory Visit (HOSPITAL_COMMUNITY): Payer: Self-pay | Admitting: *Deleted

## 2019-06-08 DIAGNOSIS — O3413 Maternal care for benign tumor of corpus uteri, third trimester: Secondary | ICD-10-CM

## 2019-06-08 DIAGNOSIS — D259 Leiomyoma of uterus, unspecified: Secondary | ICD-10-CM

## 2019-06-09 ENCOUNTER — Telehealth: Payer: Self-pay | Admitting: Radiology

## 2019-06-09 NOTE — Telephone Encounter (Signed)
Left message for patient to call [facility] to follow up to see if patient is receiving care at a different office or needs to come in as soon as possible. Patient last seen 02/09/19

## 2019-06-10 ENCOUNTER — Encounter: Payer: Self-pay | Admitting: Obstetrics & Gynecology

## 2019-06-10 ENCOUNTER — Other Ambulatory Visit: Payer: Self-pay

## 2019-06-10 ENCOUNTER — Encounter: Payer: Self-pay | Admitting: *Deleted

## 2019-06-10 ENCOUNTER — Ambulatory Visit (INDEPENDENT_AMBULATORY_CARE_PROVIDER_SITE_OTHER): Payer: BC Managed Care – PPO | Admitting: Obstetrics & Gynecology

## 2019-06-10 VITALS — BP 108/74 | HR 102 | Wt 242.0 lb

## 2019-06-10 DIAGNOSIS — O0993 Supervision of high risk pregnancy, unspecified, third trimester: Secondary | ICD-10-CM

## 2019-06-10 DIAGNOSIS — Z3A32 32 weeks gestation of pregnancy: Secondary | ICD-10-CM

## 2019-06-10 DIAGNOSIS — O34219 Maternal care for unspecified type scar from previous cesarean delivery: Secondary | ICD-10-CM

## 2019-06-10 DIAGNOSIS — O09523 Supervision of elderly multigravida, third trimester: Secondary | ICD-10-CM

## 2019-06-10 DIAGNOSIS — O99213 Obesity complicating pregnancy, third trimester: Secondary | ICD-10-CM

## 2019-06-10 DIAGNOSIS — O0990 Supervision of high risk pregnancy, unspecified, unspecified trimester: Secondary | ICD-10-CM

## 2019-06-10 DIAGNOSIS — O99210 Obesity complicating pregnancy, unspecified trimester: Secondary | ICD-10-CM

## 2019-06-10 DIAGNOSIS — O99353 Diseases of the nervous system complicating pregnancy, third trimester: Secondary | ICD-10-CM

## 2019-06-10 DIAGNOSIS — G40909 Epilepsy, unspecified, not intractable, without status epilepticus: Secondary | ICD-10-CM

## 2019-06-10 DIAGNOSIS — O99350 Diseases of the nervous system complicating pregnancy, unspecified trimester: Secondary | ICD-10-CM

## 2019-06-10 DIAGNOSIS — O09293 Supervision of pregnancy with other poor reproductive or obstetric history, third trimester: Secondary | ICD-10-CM

## 2019-06-10 NOTE — Progress Notes (Signed)
   PRENATAL VISIT NOTE  Subjective:  Anne Benjamin is a 39 y.o. G3P1011 at [redacted]w[redacted]d being seen today for ongoing prenatal care.  She is currently monitored for the following issues for this high-risk pregnancy and has Seizure ([place]); Localization-related idiopathic epilepsy and epileptic syndromes with seizures of localized onset, not intractable, without status epilepticus ([place]); Fibroids; History of cesarean delivery affecting pregnancy; Supervision of high risk pregnancy, antepartum; Maternal morbid obesity, antepartum ([place]); Multigravida of advanced maternal age; Seizure disorder during pregnancy, antepartum ([place]); Alpha thalassemia silent carrier; and History of pre-eclampsia in prior pregnancy, currently pregnant in third trimester on their problem list.  Patient reports no complaints.  Contractions: Not present. Vag. Bleeding: None.  Movement: Present. Denies leaking of fluid.   The following portions of the patient's history were reviewed and updated as appropriate: allergies, current medications, past family history, past medical history, past social history, past surgical history and problem list.   Objective:   Vitals:   06/10/19 0833  BP: 108/74  Pulse: (!) 102  Weight: 242 lb (109.8 kg)    Fetal Status: Fetal Heart Rate (bpm): 152   Movement: Present     General:  Alert, oriented and cooperative. Patient is in no acute distress.  Skin: Skin is warm and dry. No rash noted.   Cardiovascular: Normal heart rate noted  Respiratory: Normal respiratory effort, no problems with respiration noted  Abdomen: Soft, gravid, appropriate for gestational age.  Pain/Pressure: Absent     Pelvic: Cervical exam deferred        Extremities: Normal range of motion.  Edema: None  Mental Status: Normal mood and affect. Normal behavior. Normal judgment and thought content.   Assessment and Plan:  Pregnancy: G3P1011 at [redacted]w[redacted]d 1. Multigravida of advanced maternal age in third trimester Antenatal testing  weekly to start at 42 weeks, delivery 39-40 weeks. - [place] MFM FETAL BPP WO NON STRESS; Future  2. History of cesarean delivery affecting pregnancy Counseled regarding TOLAC vs RCS; risks/benefits discussed in detail. All questions answered.  Patient elects for TOLAC, consent signed 06/10/2019.  3. Seizure disorder during pregnancy, antepartum ([place]) Followed by Neurology, continue Lamictal.   4. History of pre-eclampsia in prior pregnancy, currently pregnant in third trimester Stable BP for now, will check surveillance CMP. Continue ASA. - Comprehensive metabolic panel  5. Maternal morbid obesity, antepartum ([place]) TWG 27#, doing well. Continue ASA.  6. Supervision of high risk pregnancy, antepartum Third trimester labs today.  Declines Tdap, against vaccinations in general. - Glucose Tolerance, 2 Hours w/1 Hour - CBC - RPR - HIV Antibody (routine testing w rflx) Preterm labor symptoms and general obstetric precautions including but not limited to vaginal bleeding, contractions, leaking of fluid and fetal movement were reviewed in detail with the patient. Please refer to After Visit Summary for other counseling recommendations.   Return in about 2 weeks (around 06/24/2019) for Virtual OB Visit//4 weeks from now: OFFICE OB Visit, Pelvic cultures.  Future Appointments  Date Time Provider [place]  06/23/2019  9:15 AM Darlina Rumpf, CNM [facility]  06/25/2019 11:30 AM Cameron Sprang, MD LBN-LBNG None  07/05/2019  3:30 PM Waldorf Mackey MFC-US  07/05/2019  3:30 PM [place] 1 WH-MFCUS MFC-US  07/08/2019  9:45 AM Donnamae Jude, MD [facility]    Verita Schneiders, MD

## 2019-06-10 NOTE — Patient Instructions (Addendum)
Vaginal Birth After Cesarean Delivery  Vaginal birth after cesarean delivery (VBAC) is giving birth vaginally after previously delivering a baby through a cesarean section (C-section). A VBAC may be a safe option for you, depending on your health and other factors. It is important to discuss VBAC with your health care provider early in your pregnancy so you can understand the risks, benefits, and options. Having these discussions early will give you time to make your birth plan. Who are the best candidates for VBAC? The best candidates for VBAC are women who:  Have had one or two prior cesarean deliveries, and the incision made during the delivery was horizontal (low transverse).  Do not have a vertical (classical) scar on their uterus.  Have not had a tear in the wall of their uterus (uterine rupture).  Plan to have more pregnancies. A VBAC is also more likely to be successful:  In women who have previously given birth vaginally.  When labor starts by itself (spontaneously) before the due date. What are the benefits of VBAC? The benefits of delivering your baby vaginally instead of by a cesarean delivery include:  A shorter hospital stay.  A faster recovery time.  Less pain.  Avoiding risks associated with major surgery, such as infection and blood clots.  Less blood loss and less need for donated blood (transfusions). What are the risks of VBAC? The main risk of attempting a VBAC is that it may fail, forcing your health care provider to deliver your baby by a C-section. Other risks are rare and include:  Tearing (rupture) of the scar from a past cesarean delivery.  Other risks associated with vaginal deliveries. If a repeat cesarean delivery is needed, the risks include:  Blood loss.  Infection.  Blood clot.  Damage to surrounding organs.  Removal of the uterus (hysterectomy), if it is damaged.  Placenta problems in future pregnancies. What else should I know  about my options? Delivering a baby through a VBAC is similar to having a normal spontaneous vaginal delivery. Therefore, it is safe:  To try with twins.  For your health care provider to try to turn the baby from a breech position (external cephalic version) during labor.  With epidural analgesia for pain relief. Consider where you would like to deliver your baby. VBAC should be attempted in facilities where an emergency cesarean delivery can be performed. VBAC is not recommended for home births. Any changes in your health or your baby's health during your pregnancy may make it necessary to change your initial decision about VBAC. Your health care provider may recommend that you do not attempt a VBAC if:  Your baby's suspected weight is 8.8 lb (4 kg) or more.  You have preeclampsia. This is a condition that causes high blood pressure along with other symptoms, such as swelling and headaches.  You will have VBAC less than 19 months after your cesarean delivery.  You are past your due date.  You need to have labor started (induced) because your cervix is not ready for labor (unfavorable). Where to find more information  American Pregnancy Association: americanpregnancy.org  [employer] of Obstetricians and Gynecologists: acog.org Summary  Vaginal birth after cesarean delivery (VBAC) is giving birth vaginally after previously delivering a baby through a cesarean section (C-section). A VBAC may be a safe option for you, depending on your health and other factors.  Discuss VBAC with your health care provider early in your pregnancy so you can understand the risks, benefits, options, and  have plenty of time to make your birth plan.  The main risk of attempting a VBAC is that it may fail, forcing your health care provider to deliver your baby by a C-section. Other risks are rare. This information is not intended to replace advice given to you by your health care provider. Make sure  you discuss any questions you have with your health care provider. Document Revised: 08/18/2018 Document Reviewed: 07/30/2016 Elsevier Patient Education  2020 [place] [phone]) . [facility] Davy Pique, MD; Gwendlyn Deutscher, MD; Walker Kehr, MD; Andria Frames, MD; McDiarmid, MD; Dutch Quint, MD; Nori Riis, MD; Mingo Amber, [address] [place], [place] [address] o [phone] o Mon-Fri 8:30-12:30, 1:30-5:00 o Providers come to see babies at [facility] o Accepting Medicaid . [place] at [place] providers who accept newborns: Dorthy Cooler, MD; Orland Mustard, MD; Stephanie Acre, MD o [place], [place], [place] [address] o [phone] o Mon-Fri 8:00-5:30 o Babies seen by providers at [facility] o Does NOT accept Medicaid o Please call early in hospitalization for appointment (limited availability)  . [facility], MD o [address] [place], [place] [address] o [phone] o Mon, Tue, Thur, Fri 8:30-5:00, Wed 10:00-7:00 (closed 1-2pm) o Babies seen by [facility] providers o Accepting Medicaid . [place], MD o [place], [place], [place] [address] o [phone] o Mon-Fri 8:30-5:00, Sat 8:30-12:00 o Provider comes to see babies at [place] Medicaid o Must have been referred from current patients or contacted office prior to delivery . [place] [facility] ([place] [facility] [place]) Franne Forts, MD; Tamera Punt, MD; Doneen Poisson, MD; Fatima Sanger, MD; Wynetta Emery, MD; Jess Barters, MD; Tami Ribas, MD; Herbert Moors, MD; Derrell Lolling, MD; Dorothyann Peng, MD; Lucious Groves, NP; Baldo Ash, NP o [place], [place], [place] [address] o [phone] o Mon, Tue, Thur, Fri 8:30-5:30, Wed 9:30-5:30, Sat 8:30-12:30 o Babies seen by [facility] providers o Accepting  Medicaid o Only accepting infants of first-time parents or siblings of current patients [facility] discharge coordinator will make follow-up appointment . Baltazar Najjar o [place] [address] [place], [place]  [address] o [phone]   Fax - [phone] . [facility] o [id number] [address], [place], [place]  [address] o Phone - [phone]   Fax [phone] . [place], [place], [place], [place]  [address] o [phone]  [place] [phone]) . [facility] Reginal Lutes, MD; Jacklynn Ganong, MD; Torrie Mayers, MD; MD; Rosana Hoes, MD; Servando Salina, MD; Rose Fillers, MD; Rex Kras, MD; Corinna Capra, MD; Volney American, MD; Trilby Drummer, MD; Janann Colonel, MD; Jimmye Norman, [place], [place], [place] [address] o [phone] o Mon-Fri 8:30-5:00 (extended evenings Mon-Thur as needed), Sat-Sun 10:00-1:00 o Providers come to see babies at [place] Medicaid for families of first-time babies and families with all children in the household age 70 and under. Must register with office prior to making appointment (M-F only). . [place], NP; Tomi Bamberger, MD; Redmond School, MD; [place], [place] [address] [place],  [address] o [phone] o Mon-Fri 8:00-5:00 o Babies seen by providers at [facility] o Does NOT accept Medicaid/Commercial Insurance Only . [facility] at [place] ([facility])  Marnee Guarneri, MD; Drema Dallas, MD; Montine Circle, MD; Vilma Prader, MD; Vanita Panda, MD; Alfonso Ramus, MD; Ruthann Cancer, MD;  [place], MD; Rosalva Ferron, MD; Polly Cobia, MD o [address] [place], [place] [address] o [phone] o Mon-Fri 8:30-5:30, Sat (Oct.-Mar.) 9:00-1:00 o Babies seen by providers at [place] [phone]) . [facility] Peers, MD; Suzan Slick, MD o [address], [place], [place] [address] o [phone] o Mon-Fri 8:30-5:00, Sat 8:30-12:00 o Providers come  to see babies at [facility] o Does NOT accept Medicaid . [place] at [place], [place]; [place], MD; [place], [place]; Nancy Fetter, MD; Moreen Fowler, [place], [place], [place] [address] o [phone] o Mon-Fri 8:00-5:00 o Babies seen by providers at [facility] o Does NOT accept Medicaid o Only accepting babies of parents who are patients o Please call early in hospitalization for appointment (limited availability) . [facility] Blanca Friend, MD; Sharlene Motts, MD; Rod Can, MD; Warner Mccreedy, NP; Sabra Heck, MD; Ermalinda Memos, MD; Sharlett Iles, NP; Aurther Loft, MD; Jerrye Beavers, MD; Marcello Moores, MD; Berline Lopes, MD; Charolette Forward, MD o [place]. [place], [place], [place] [address] o [phone] o Mon-Fri 8:00-5:00, Sat 9:00-12:00 o Providers come to see babies at [facility] o Does NOT accept [facility] [phone]) . [place] at [place] providers accepting new patients: Dayna Ramus, NP; [place], [place], [place], [place] [address] o [phone] o Mon-Fri 8:00-5:00 o Babies seen by providers at [facility] o Does NOT accept Medicaid o Only accepting babies of parents who are patients o Please call early in hospitalization for appointment (limited availability) . [facility] Oswaldo Conroy, MD; Sheran Lawless, MD o [address] [place], [place] [address] o [phone] (press 1 to schedule appointment) o Mon-Fri 8:00-5:00 o Providers come to see babies at [facility] o Does NOT accept Medicaid . [facility] Jodi Mourning, MD o [address] [place], [place] [address] o [phone] o Mon-Fri 8:30-5:00 (lunch 12:30-1:00), extended hours by appointment only Wed 5:00-6:30 o Babies seen by [facility] providers o Accepting Medicaid . [place] at Evalyn Casco, MD; [place], MD; Ethlyn Gallery, MD o [place], [place], [place] [address] o [phone] o Mon-Fri 8:00-5:00 o Babies seen by [facility] providers o Does NOT accept Medicaid . Therapist, music at [place], MD; Yong Channel, MD; [place], [address] [place], [place] [address] o [phone] o Mon-Fri 8:00-5:00 o Babies seen by [facility] providers o Does NOT accept Medicaid . [place], [place]; [place], [place]; [place], NP; Albertina Parr, MD; Frederic Jericho, MD; Ronney Lion, MD; Carlos Levering, NP; Jerelene Redden, NP; Tomasita Crumble, NP; Ronelle Nigh, NP; Corinna Lines, MD; [place], MD o [address] [place], [place] [address] o [phone] o Mon-Fri 8:30-5:00, Sat 10:00-1:00 o Providers come to see babies at [facility] o Does NOT accept Medicaid o Free prenatal information session Tuesdays at 4:45pm . [facility] Porfirio Oar, MD; [place], [place]; [place], [place]; [place], [place]., [place] [address] o [phone] o Mon-Fri 7:30-5:30 o Babies seen by [facility] providers . [facility] Children's Doctor o [address], [place], [place], [place]  [address] o [phone]   Fax - [phone]  [place] [phone] & [phone]) . [place], MD o [address] [place],  [address] o [phone] o Mon-Thur 8:00-6:00 o Providers come to see babies at [place] Medicaid . [place], NP; Melford Aase, MD; [place], [place]; [place], [place],  [place], [place] [address] o [phone] o Mon-Thur 7:30-7:30, Fri 7:30-4:30 o Babies seen by [facility] providers o Accepting Medicaid . [facility] Nyra Jabs, MD; Cristino Martes, NP; Gertie Baron, MD o [place] [address], [place], [place] [address] o [phone] o Mon-Fri 8:30-5:00, Sat 8:30-12:00 o Providers come to see babies at [place] Medicaid o Must have "Meet & Greet" appointment at office prior to delivery . [place] ([place]) Jodene Nam, MD;  Juleen China, MD; Clydene Laming, [place] [address], [place], [place] [address] o [phone] o Mon-Wed 8:00-6:00, Thur-Fri 8:00-5:00, Sat 9:00-12:00 o Providers come to see babies at [facility] o Does NOT accept Medicaid o Only accepting siblings of current patients . [facility] [place], [place], [place], [place]  [address] o [phone]   Fax [phone] . [place] at [place] [place], [place]  [address] o [phone]   Fax - [place] [phone] & [phone]) . Therapist, music at [place], DO; [place], [address] [place], [place] [address] o [phone] o Mon-Fri 7:00-5:00 o Babies seen by [facility] providers o Does NOT accept Medicaid . [place], MD; [place], [place]; [place], [place], [place], [place] [address] o [phone] o Mon-Fri 8:00-5:00 o Babies seen by [facility] providers o Accepting Medicaid . [place], MD; [place], [place]; [place], NP; [place], [place] [address], [place], [place] [address] o [phone] o Mon-Fri 8:00-5:00 o Babies seen by providers at [facility] [phone]) . [facility] [place], [place] o [address] [place], [place] [address] o [phone] o Mon-Fri 8:00-5:00 o Babies seen by [facility] providers o Does NOT accept Medicaid o Limited availability, please call early in hospitalization to schedule follow-up . [facility] Leilani Merl, PA; Maisie Fus, MD; [place], MD; [place], [place]; Jeannine Kitten, MD; [place], [place] [facility] [address], [place], [place] [address] o [phone] o Mon-Fri 8:30-5:00, Sat 9:00-12:00 o Babies seen by providers at [facility] o Accepting  Medicaid o Please register online then schedule online or call office o [email or web address] . [place] ([place] at [place]) Kristian Covey, NP; Dwyane Dee, MD; Leonidas Romberg, PA o [address] [place], [place], [place] [address] o [phone] o Mon-Fri 8:00-5:00 o Babies seen by providers at [facility] o Accepting Medicaid . [place] ([facility] [employer]) Dairl Ponder, MD; Rayvon Char, NP; Melina Modena, MD o [address] [place], [place],  [address] o [phone] o Mon-Fri 8:00-5:30, Sat&Sun by appointment (phones open at 8:30) o Babies seen by [facility] providers o Accepting Medicaid o Must be a first-time baby or sibling of current patient . [place], [id number], [place],   [address] o [phone]   Fax - [phone]  [place] [phone] & [phone]) . [place], [place]; [place], [place]; Benjamine Mola, MD; [place], [place]; Harrell Lark, MD o [address], [place], [place] [address] o [phone] o Mon-Thur 8:00-7:00, Fri 8:00-5:00, Sat 8:00-12:00, Sun 9:00-12:00 o Babies seen by [facility] providers o Accepting Medicaid . [facility] at [facility], MD; Ruthann Cancer, MD; [facility], MD o [address], [place], [place] [address] o [phone] o Mon-Thur 8:00-5:00 o Babies seen by providers at [facility] o Accepting Medicaid . [facility] at [place], MD; [place], MD; Amedeo Plenty, MD; Bobby Rumpf, MD; List, MD; Lavonia Drafts, MD; Ruthann Cancer, MD; Selinda Eon, MD; Audie Box, MD; Jim Like, MD; Christie Nottingham, MD; Hubbard Hartshorn, MD; Modena Nunnery, MD o [address] [place], [place] [address] o [phone] o Mon-Fri 8:00-5:30, Sat (Oct.-Mar.) 9:00-1:00 o Babies seen by providers at [facility] o Accepting Medicaid o Must fill out new patient packet, available  online at [email or web address] . [place] ([facility]) Barnabas Lister, NP; Kenton Kingfisher, NP; Claiborne Billings, NP; Rolla Plate, MD; [place], [place]; Carola Rhine, MD; Tyron Russell, MD; Delia Chimes, NP o [address], [place], [place] [address] o [phone] o Mon-Thur 8:00-5:30, Fri 8:00-5:00 o Babies seen by providers at [place] [phone]) . [place], [place]; [place], MD; Dennard Schaumann, MD; [place], [place] o [address] [place], [place] [address] o [phone] o Mon-Fri 8:00-5:00 o Babies seen by providers at [place] [phone]) . [place] at [place], [place]; Olen Pel, MD; [place], [place], [place], [place] [address] o [phone] o Mon-Fri 8:00-5:00 o Babies seen by providers at [facility] o Does NOT accept Medicaid o Limited appointment availability, please call early in hospitalization  . Therapist, music at [place], [place]; [place], [address], [place], [place] [address] o [phone] o Mon-Fri 8:00-5:00 o Babies seen by [facility] providers o Does NOT accept Medicaid . [facility] Su Grand, MD; Guy Sandifer, MD; [place], [place]; [place], [place] [address], [place], [place] [address] o [phone] o Mon-Fri 8:00-5:00 o After hours clinic [facility] [place], [place] [address]) [phone] Mon-Fri 5:00-8:00, Sat 12:00-6:00, Sun 10:00-4:00 o Babies seen by [facility] providers o Accepting Medicaid . [place] at [facility] o [address] [place], [place]  [address] o [phone]   Fax - [phone]  [place] [phone]) . Therapist, music at [facility], MD o [address] [place], [place], [place] [address] o [phone] o Mon-Fri 8:00-5:00 o Babies seen by [facility] providers o Does NOT  accept Medicaid . [place] ([place] at [place]) Bing Neighbors, MD o [address] [place], [place], [place] [address] o [phone] o Mon-Thur 8:00-7:00, Fri 8:00-5:00, Sat 8:00-12:00 o Babies seen by providers at [facility] o Accepting Medicaid - but does not have vaccinations in office (must be received elsewhere) o Limited availability, please call early in hospitalization  [place] ([id number]) . [place], [place] [address], [place] [address] o [phone]  Fax [phone]

## 2019-06-11 LAB — GLUCOSE TOLERANCE, 2 HOURS W/ 1HR
Glucose, 1 hour: 125 mg/dL (ref 65–179)
Glucose, 2 hour: 91 mg/dL (ref 65–152)
Glucose, Fasting: 78 mg/dL (ref 65–91)

## 2019-06-11 LAB — CBC
Hematocrit: 35.2 % (ref 34.0–46.6)
Hemoglobin: 12.1 g/dL (ref 11.1–15.9)
MCH: 29.1 pg (ref 26.6–33.0)
MCHC: 34.4 g/dL (ref 31.5–35.7)
MCV: 85 fL (ref 79–97)
Platelets: 216 10*3/uL (ref 150–450)
RBC: 4.16 x10E6/uL (ref 3.77–5.28)
RDW: 14 % (ref 11.7–15.4)
WBC: 11.9 10*3/uL — ABNORMAL HIGH (ref 3.4–10.8)

## 2019-06-11 LAB — RPR: RPR Ser Ql: NONREACTIVE

## 2019-06-11 LAB — HIV ANTIBODY (ROUTINE TESTING W REFLEX): HIV Screen 4th Generation wRfx: NONREACTIVE

## 2019-06-23 ENCOUNTER — Telehealth (INDEPENDENT_AMBULATORY_CARE_PROVIDER_SITE_OTHER): Payer: BC Managed Care – PPO | Admitting: Advanced Practice Midwife

## 2019-06-23 DIAGNOSIS — Z3A34 34 weeks gestation of pregnancy: Secondary | ICD-10-CM

## 2019-06-23 DIAGNOSIS — O0993 Supervision of high risk pregnancy, unspecified, third trimester: Secondary | ICD-10-CM

## 2019-06-23 DIAGNOSIS — O0990 Supervision of high risk pregnancy, unspecified, unspecified trimester: Secondary | ICD-10-CM

## 2019-06-23 DIAGNOSIS — O09293 Supervision of pregnancy with other poor reproductive or obstetric history, third trimester: Secondary | ICD-10-CM

## 2019-06-23 DIAGNOSIS — Z880 Allergy status to penicillin: Secondary | ICD-10-CM | POA: Insufficient documentation

## 2019-06-23 DIAGNOSIS — O34219 Maternal care for unspecified type scar from previous cesarean delivery: Secondary | ICD-10-CM

## 2019-06-23 NOTE — Progress Notes (Signed)
I connected with  Anne Benjamin on 06/23/19 at  9:15 AM EST by telephone and verified that I am speaking with the correct person using two identifiers.   I discussed the limitations, risks, security and privacy concerns of performing an evaluation and management service by telephone and the availability of in person appointments. I also discussed with the patient that there may be a patient responsible charge related to this service. The patient expressed understanding and agreed to proceed.  Crosby Oyster, RN 06/23/2019  9:14 AM

## 2019-06-23 NOTE — Patient Instructions (Signed)

## 2019-06-23 NOTE — Progress Notes (Signed)
TELEHEALTH OBSTETRICS PRENATAL VIRTUAL VIDEO VISIT ENCOUNTER NOTE  Provider location: [facility] [place] [facility]   I connected with Anne Benjamin on 06/23/19 at  9:15 AM EST by MyChart Video Encounter at home and verified that I am speaking with the correct person using two identifiers.   I discussed the limitations, risks, security and privacy concerns of performing an evaluation and management service virtually and the availability of in person appointments. I also discussed with the patient that there may be a patient responsible charge related to this service. The patient expressed understanding and agreed to proceed. Subjective:  Anne Benjamin is a 39 y.o. G3P1011 at [redacted]w[redacted]d being seen today for ongoing prenatal care.  She is currently monitored for the following issues for this high-risk pregnancy and has Seizure ([place]); Localization-related idiopathic epilepsy and epileptic syndromes with seizures of localized onset, not intractable, without status epilepticus ([place]); Fibroids; History of cesarean delivery affecting pregnancy; Supervision of high risk pregnancy, antepartum; Maternal morbid obesity, antepartum ([place]); Multigravida of advanced maternal age; Seizure disorder during pregnancy, antepartum ([place]); Alpha thalassemia silent carrier; History of pre-eclampsia in prior pregnancy, currently pregnant in third trimester; and History of penicillin allergy on their problem list.  Patient reports no complaints.  Contractions: Not present. Vag. Bleeding: None.  Movement: Present. Denies any leaking of fluid.   The following portions of the patient's history were reviewed and updated as appropriate: allergies, current medications, past family history, past medical history, past social history, past surgical history and problem list.   Objective:  There were no vitals filed for this visit.  Fetal Status:     Movement: Present     General:  Alert, oriented and cooperative. Patient is  in no acute distress.  Respiratory: Normal respiratory effort, no problems with respiration noted  Mental Status: Normal mood and affect. Normal behavior. Normal judgment and thought content.  Rest of physical exam deferred due to type of encounter  Imaging: [place] MFM OB FOLLOW UP  Result Date: 06/07/2019 ----------------------------------------------------------------------  OBSTETRICS REPORT                       (Signed Final 06/07/2019 05:17 pm) ---------------------------------------------------------------------- Patient Info  ID #:       [id number]                          D.O.B.:  [birth year] (39 yrs)  Name:       Anne Benjamin                      Visit Date: 06/07/2019 03:05 pm ---------------------------------------------------------------------- Performed By  Performed By:     Georgie Chard        Ref. Address:     [address]  [place], [place]  Attending:        Johnell Comings MD         [facility]  Referred By:      Osborne Oman MD ---------------------------------------------------------------------- Orders   #  Description                          Code         Ordered By   1  [place] MFM OB FOLLOW UP                  [id number]     [place]  ----------------------------------------------------------------------   #  Order #                    Accession #                 Episode #   1  [id number]                  [id number]                  [id number]  ---------------------------------------------------------------------- Indications   Maternal morbid obesity (BMI 1)               O99.210 E66.01   Seizure disorder                               O99.350 G40.909   Uterine fibroids                                O34.10   Previous cesarean delivery, antepartum x1      O34.219   Low Risk NIPS   Genetic carrier (Alpha Thalassemia Silent      Z14.8   Carrier)   Encounter for other antenatal screening        Z36.2   follow-up   Advanced maternal age multigravida 25+,        O17.523   third trimester (73)   [redacted] weeks gestation of pregnancy                Z3A.32  ---------------------------------------------------------------------- Vital Signs                                                 Height:  4'11" ---------------------------------------------------------------------- Fetal Evaluation  Num Of Fetuses:         1  Fetal Heart Rate(bpm):  167  Cardiac Activity:       Observed  Presentation:           Variable  Placenta:               Anterior  P. Cord Insertion:      Visualized  Amniotic Fluid  AFI FV:      Within normal limits  AFI Sum(cm)     %Tile       Largest Pocket(cm)  12.06           32          3.42  RUQ(cm)       RLQ(cm)       LUQ(cm)        LLQ(cm)  2.84          2.54          3.42           3.26 ---------------------------------------------------------------------- Biometry  BPD:      84.8  mm     G. Age:  34w 1d         93  %    CI:        79.03   %    70 - 86                                                          FL/HC:      19.8   %    19.1 - 21.3  HC:      301.6  mm     G. Age:  33w 3d         30  %    HC/AC:      1.07        0.96 - 1.17  AC:      283.1  mm     G. Age:  32w 2d         59  %    FL/BPD:     70.4   %    71 - 87  FL:       59.7  mm     G. Age:  31w 1d         16  %    FL/AC:      21.1   %    20 - 24  HUM:      53.8  mm     G. Age:  31w 2d         37  %  Est. FW:    1931  gm      4 lb 4 oz     47  % ---------------------------------------------------------------------- OB History  Gravidity:    3         Term:   1         SAB:   1  Living:       1 ---------------------------------------------------------------------- Gestational Age  LMP:           32w 0d         Date:  10/26/18                 EDD:  08/02/19  U/S Today:     32w 5d                                        EDD:   07/28/19  Best:          Milderd Meager 0d     Det. By:  Previous Ultrasound      EDD:   08/02/19                                      (01/12/19) ---------------------------------------------------------------------- Anatomy  Cranium:               Appears normal         Aortic Arch:            Previously seen  Cavum:                 Appears normal         Ductal Arch:            Previously seen  Ventricles:            Appears normal         Diaphragm:              Appears normal  Choroid Plexus:        Previously seen        Stomach:                Appears normal, left                                                                        sided  Cerebellum:            Previously seen        Abdomen:                Appears normal  Posterior Fossa:       Previously seen        Abdominal Wall:         Previously seen  Nuchal Fold:           Not applicable ([id number]    Cord Vessels:           Previously seen                         wks GA)  Face:                  Orbits and profile     Kidneys:                Appear normal                         previously seen  Lips:                  Appears normal         Bladder:  Appears normal  Thoracic:              Appears normal         Spine:                  Previously seen  Heart:                 Previously seen        Upper Extremities:      Previously seen  RVOT:                  Previously seen        Lower Extremities:      Previously seen  LVOT:                  Previously seen ---------------------------------------------------------------------- Comments  This patient was seen for a follow up growth scan due to  maternal obesity and a fibroid uterus.  She denies any  problems since her last exam.  She was informed that the fetal growth and amniotic fluid  level appears appropriate for her gestational age.  A follow up exam was scheduled in 4 weeks.  ----------------------------------------------------------------------                   Johnell Comings, MD Electronically Signed Final Report   06/07/2019 05:17 pm ----------------------------------------------------------------------   Assessment and Plan:  Pregnancy: [id number] at [redacted]w[redacted]d 1. Supervision of high risk pregnancy, antepartum - AMA surveillance at 36 weeks - Confirmed location of MAU  2. History of pre-eclampsia in prior pregnancy, currently pregnant in third trimester - Normotensive this pregnancy  3. History of cesarean delivery affecting pregnancy - TOLAC consent signed   4. History of penicillin allergy - Previously documented as nausea/vomiting during childhood exposure - Patient endorsed anaphylactic reaction, reviewed GBS with sensitivities next visit  Preterm labor symptoms and general obstetric precautions including but not limited to vaginal bleeding, contractions, leaking of fluid and fetal movement were reviewed in detail with the patient. I discussed the assessment and treatment plan with the patient. The patient was provided an opportunity to ask questions and all were answered. The patient agreed with the plan and demonstrated an understanding of the instructions. The patient was advised to call back or seek an in-person office evaluation/go to [facility]U at [facility] for any urgent or concerning symptoms. Please refer to After Visit Summary for other counseling recommendations.   I provided seven minutes of face-to-face time during this encounter.  Return in about 2 weeks (around 07/07/2019) for 36 week swabs.  Future Appointments  Date Time Provider [place]  06/25/2019 11:30 AM Cameron Sprang, MD [facility] None  07/05/2019  3:30 PM [place] [facility]  07/05/2019  3:30 PM [place] 1 WH-MFCUS MFC-US  07/08/2019  9:45 AM Donnamae Jude, MD [facility]    Darlina Rumpf, [place] for [employer], [place]

## 2019-06-25 ENCOUNTER — Encounter: Payer: Self-pay | Admitting: Neurology

## 2019-06-25 ENCOUNTER — Other Ambulatory Visit: Payer: Self-pay

## 2019-06-25 ENCOUNTER — Telehealth (INDEPENDENT_AMBULATORY_CARE_PROVIDER_SITE_OTHER): Payer: BC Managed Care – PPO | Admitting: Neurology

## 2019-06-25 VITALS — Ht 59.0 in

## 2019-06-25 DIAGNOSIS — G40009 Localization-related (focal) (partial) idiopathic epilepsy and epileptic syndromes with seizures of localized onset, not intractable, without status epilepticus: Secondary | ICD-10-CM

## 2019-06-25 MED ORDER — LAMOTRIGINE 100 MG PO TABS: ORAL_TABLET | ORAL | 11 refills | Status: DC

## 2019-06-25 NOTE — Progress Notes (Signed)
Virtual Visit via Video Note The purpose of this virtual visit is to provide medical care while limiting exposure to the novel coronavirus.    Consent was obtained for video visit:  Yes.   Answered questions that patient had about telehealth interaction:  Yes.   I discussed the limitations, risks, security and privacy concerns of performing an evaluation and management service by telemedicine. I also discussed with the patient that there may be a patient responsible charge related to this service. The patient expressed understanding and agreed to proceed.  Pt location: Home Physician Location: office Name of referring provider:  [name], MD I connected with Anne Benjamin at patients initiation/request on 06/25/2019 at 11:30 AM EST by video enabled telemedicine application and verified that I am speaking with the correct person using two identifiers. Pt MRN:  [id number] Pt DOB:  April 24, 1981 Video Participants:  Anne Benjamin   History of Present Illness:  The patient was seen as a virtual video visit on 06/25/2019. She was last seen 6 months ago for seizures, likely arising from the left temporal lobe. She is on Lamotrigine 112m 2 tabs BID (2061mBID), seizure-free since 04/2018, no side effects. On her last visit, she reported having a positive pregnancy test, we had discussed monthly Lamictal levels, which were not able to be done. 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 No headaches, dizziness, no falls. She usually gets 8 hours of sleep. She hopes to breastfeed.   HPI: This is a pleasant 89o RH woman with seizures since 09/01/2012. She was at home when her husband heard her fall on the floor and found her shaking with all extremities extended. She had told him she felt tired that day after being up for 72 hours  studying and finishing a paper for school. She woke up with EMS around her, feeling tired, no focal weakness. Per EMS report, she had a post-ictal phase of approximately 30 minutes. CBC showed a WBC of 10.7, BMP unremarkable. She had seen neurologist Dr. WiJannifer Franklinnd routine EEG done showed intermittent dysrhythmic theta slowing from the left mid-temporal region, with occasional sharp transients. At no time did there appear to be evidence of spike or spike wave discharges. She was unable to do MRI brain ordered. She was started on Keppra 50071mID, however she felt very sleepy and unable to function on this, and had self-reduced dose to 250m42mily. She had been on this dose and had another seizure that occurred out of sleep in June/July 2015. Her husband heard her shaking, with urinary incontinence and tongue bite. She may have been sleep-deprived that time. She did not seek medical attention. On 02/24/14, she was asleep at 4am when her husband woke up to her having a convulsion that lasted 30 seconds. She went back to sleep. He came back in the room at 10am and she recalls asking him if she had another seizure, he reported that she looked like she had another one and there was a wet spot on her pajamas. Around noon, he heard a noise but thought it was a dog outside. Then at 3:30pm, he was in the room and again heard the same noise and found her having another convulsion. [facility] she was noted to be febrile with rectal temperature of 101.3. Her WBC was 17.2. She had a head CT  which I personally reviewed which was normal. She had a lumbar puncture which was traumatic with 700 RBC pink hazy CSF, 2 WBC, protein 13, glucose 85, gram stain and culture, VDRL, HSV, and Cryptococcal Ab negative. She was started on Dilantin, last level 8.0, discharged home on Dilantin 124m in AM, 1581mat 3pm, 10070mhs which also caused drowsiness. She was started on Lamictal in October 2015.   She had a breakthrough  seizure last 05/30/16 after being seizure-free for 2 years on Lamotrigine 100m86mD with no side effects. She was at work then recalls feeling extremely nauseated. She had a bad night of sleep the night prior due to significant menstrual cramps, which is unusual for her. She denied any infection, no missed medication.   Her husband had noticed infrequent blanking out episodes lasting 10 seconds a year ago, none recently. She denies any gaps in time, no olfactory/gustatory hallucinations, deja vu, rising epigastric sensation, focal numbness/tingling/weakness, myoclonic jerks.   Epilepsy Risk Factors: Her paternal half-sister has seizures. She had 2 concussions in her 20s,55se with loss of consciousness. Otherwise she had a normal birth and early development. There is no history of febrile convulsions, CNS infections such as meningitis/encephalitis, significant traumatic brain injury, neurosurgical procedures.  Prior AEDs: Keppra, Dilantin  Lamictal level 06/05/17 on LTG 150mg44m: 6.5     Current Outpatient Medications on File Prior to Visit  Medication Sig Dispense Refill  . aspirin EC 81 MG tablet Take 1 tablet (81 mg total) by mouth daily. Take after 12 weeks for prevention of preeclampsia later in pregnancy 300 tablet 2  . Blood Pressure KIT 1 Device by Does not apply route once a week. To be monitored weekly from home 1 kit 0  . ferrous sulfate 325 (65 FE) MG EC tablet Take 325 mg by mouth 3 (three) times daily with meals.    . folic acid (FOLVITE) 1 MG tablet Take 1 tablet by mouth once daily 90 tablet 0  . lamoTRIgine (LAMICTAL) 100 MG tablet Take 2 tablets twice a day 120 tablet 11  . Multiple Vitamin (MULTIVITAMIN) tablet Take 1 tablet by mouth daily.     No current facility-administered medications on file prior to visit.     Observations/Objective:   Vitals:   06/25/19 1025  Height: '4\' 11"'  (1.499 m)   GEN:  The patient appears stated age and is in NAD.  Neurological  examination: Patient is awake, alert, oriented x 3. No aphasia or dysarthria. Intact fluency and comprehension. Remote and recent memory intact. Cranial nerves: Extraocular movements intact with no nystagmus. No facial asymmetry. Motor: moves all extremities symmetrically, at least anti-gravity x 4. No incoordination on finger to nose testing. 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 Routine EEG in 2014 had shown left temporal slowing, MRI brain normal. She has been seizure-free since 04/2018, on Lamotrigine 200mg 48mwithout side effects. She is now on her last trimester of pregnancy, due next month. We again discussed how Lamotrigine levels can fall during pregnancy, thankfully she has been doing well, Lamotrigine level will be done for this month. Continue daily folic acid and prenatal vitamins. We discussed avoidance of seizure triggers, including sleep deprivation once the baby comes. She plans to breastfeed which I fully support. She is aware of [place] driving laws to stop driving until 6 months seizure-free. She  will follow-up in 4 months and knows to call our office for any changes.    Follow Up Instructions:   -I discussed the assessment and treatment plan with the patient. The patient was provided an opportunity to ask questions and all were answered. The patient agreed with the plan and demonstrated an understanding of the instructions.   The patient was advised to call back or seek an in-person evaluation if the symptoms worsen or if the condition fails to improve as anticipated.   Cameron Sprang, MD

## 2019-06-26 ENCOUNTER — Encounter (HOSPITAL_COMMUNITY): Payer: Self-pay | Admitting: Family Medicine

## 2019-06-26 ENCOUNTER — Inpatient Hospital Stay (HOSPITAL_COMMUNITY)
Admission: AD | Admit: 2019-06-26 | Discharge: 2019-06-26 | Disposition: A | Discharge status: Home / Self Care | Payer: BC Managed Care – PPO | Attending: Family Medicine | Admitting: Family Medicine

## 2019-06-26 ENCOUNTER — Other Ambulatory Visit: Payer: Self-pay

## 2019-06-26 DIAGNOSIS — Z7982 Long term (current) use of aspirin: Secondary | ICD-10-CM | POA: Insufficient documentation

## 2019-06-26 DIAGNOSIS — Z3A34 34 weeks gestation of pregnancy: Secondary | ICD-10-CM | POA: Diagnosis not present

## 2019-06-26 DIAGNOSIS — O09523 Supervision of elderly multigravida, third trimester: Secondary | ICD-10-CM | POA: Insufficient documentation

## 2019-06-26 DIAGNOSIS — O99353 Diseases of the nervous system complicating pregnancy, third trimester: Secondary | ICD-10-CM | POA: Diagnosis present

## 2019-06-26 DIAGNOSIS — R569 Unspecified convulsions: Secondary | ICD-10-CM

## 2019-06-26 DIAGNOSIS — G40909 Epilepsy, unspecified, not intractable, without status epilepticus: Secondary | ICD-10-CM | POA: Diagnosis present

## 2019-06-26 DIAGNOSIS — Z79899 Other long term (current) drug therapy: Secondary | ICD-10-CM | POA: Insufficient documentation

## 2019-06-26 DIAGNOSIS — Z880 Allergy status to penicillin: Secondary | ICD-10-CM | POA: Diagnosis not present

## 2019-06-26 DIAGNOSIS — O99350 Diseases of the nervous system complicating pregnancy, unspecified trimester: Secondary | ICD-10-CM

## 2019-06-26 LAB — URINALYSIS, ROUTINE W REFLEX MICROSCOPIC
Bilirubin Urine: NEGATIVE
Glucose, UA: NEGATIVE mg/dL
Hgb urine dipstick: NEGATIVE
Ketones, ur: 20 mg/dL — AB
Nitrite: NEGATIVE
Protein, ur: NEGATIVE mg/dL
Specific Gravity, Urine: 1.016 (ref 1.005–1.030)
pH: 6 (ref 5.0–8.0)

## 2019-06-26 LAB — COMPREHENSIVE METABOLIC PANEL
ALT: 24 U/L (ref 0–44)
AST: 23 U/L (ref 15–41)
Albumin: 2.6 g/dL — ABNORMAL LOW (ref 3.5–5.0)
Alkaline Phosphatase: 122 U/L (ref 38–126)
Anion gap: 10 (ref 5–15)
BUN: 5 mg/dL — ABNORMAL LOW (ref 6–20)
CO2: 21 mmol/L — ABNORMAL LOW (ref 22–32)
Calcium: 8.8 mg/dL — ABNORMAL LOW (ref 8.9–10.3)
Chloride: 108 mmol/L (ref 98–111)
Creatinine, Ser: 0.74 mg/dL (ref 0.44–1.00)
GFR calc Af Amer: >60 mL/min (ref >60)
GFR calc non Af Amer: >60 mL/min (ref >60)
Glucose, Bld: 105 mg/dL — ABNORMAL HIGH (ref 70–99)
Potassium: 4 mmol/L (ref 3.5–5.1)
Sodium: 139 mmol/L (ref 135–145)
Total Bilirubin: 0.5 mg/dL (ref 0.3–1.2)
Total Protein: 5.7 g/dL — ABNORMAL LOW (ref 6.5–8.1)

## 2019-06-26 LAB — PROTEIN / CREATININE RATIO, URINE
Creatinine, Urine: 110.4 mg/dL
Protein Creatinine Ratio: 0.18 mg/mg{Cre} — ABNORMAL HIGH (ref 0.00–0.15)
Total Protein, Urine: 20 mg/dL

## 2019-06-26 LAB — CBC
HCT: 37.5 % (ref 36.0–46.0)
Hemoglobin: 12.3 g/dL (ref 12.0–15.0)
MCH: 28.7 pg (ref 26.0–34.0)
MCHC: 32.8 g/dL (ref 30.0–36.0)
MCV: 87.6 fL (ref 80.0–100.0)
Platelets: 240 10*3/uL (ref 150–400)
RBC: 4.28 MIL/uL (ref 3.87–5.11)
RDW: 14.6 % (ref 11.5–15.5)
WBC: 14.7 10*3/uL — ABNORMAL HIGH (ref 4.0–10.5)
nRBC: 0 % (ref 0.0–0.2)

## 2019-06-26 NOTE — MAU Note (Signed)
Pt reports to mau after having a seizure at home.  Pt reports known seizure disorder but has not had one in over a year.  Pt reports seizure happened in her sleep around 0300 and was witnessed by her partner.  EMS was called and performed cbg which pt reports was normal.  Pt denies any pain or lof at this time and reports good fetal movement.

## 2019-06-26 NOTE — MAU Provider Note (Signed)
History     CSN: [id number]  Arrival date and time: 06/26/19 1250   First Provider Initiated Contact with Patient 06/26/19 1335      Chief Complaint  Patient presents with  . Seizures   HPI  [name] is a 39 y.o. year old G35P1011 female at 61w5dweeks gestation who presents to MAU reporting she had a seizure at home at 0300 this morning. She reports the seizure happened while was asleep and it was witnessed by her husband. EMS was called to their home for evaluation. She reports that EMS checked her VS and BS; which she said were normal but unsure of the numbers. She denies abdominal pain, VB or LOF. She reports (+) FM all day today. She has a known seizure history. She had a virtual visit with her neurologist, Dr. KEllouise Newer yesterday (06/25/2019) and is supposed to f/u with her in 4 months. She takes Lamotrigine 200 mg BID. She reportedly has not had a seizure since 04/2018. [facility]  Past Medical History:  Diagnosis Date  . [place]  . Obesity   . [place]     Past Surgical History:  Procedure Laterality Date  . CESAREAN SECTION     x 1    Family History  Problem Relation Age of Onset  . Hypertension Mother     Social History   Tobacco Use  . Smoking status: Never Smoker  . Smokeless tobacco: Never Used  Substance Use Topics  . Alcohol use: Not Currently    Comment: occasional  . Drug use: No    Allergies:  Allergies  Allergen Reactions  . Penicillins Anaphylaxis    Has patient had a PCN reaction causing immediate rash, facial/tongue/throat swelling, SOB or lightheadedness with hypotension: yes Has patient had a PCN reaction causing severe rash involving mucus membranes or skin necrosis: no Has patient had a PCN reaction that required hospitalization : unknown Has patient had a PCN reaction occurring within the last 10 years: no If all of the above answers are "NO", then  may proceed with Cephalosporin use.   .Haig ProphetOil Swelling    Medications Prior to Admission  Medication Sig Dispense Refill Last Dose  . aspirin EC 81 MG tablet Take 1 tablet (81 mg total) by mouth daily. Take after 12 weeks for prevention of preeclampsia later in pregnancy 300 tablet 2   . Blood Pressure KIT 1 Device by Does not apply route once a week. To be monitored weekly from home 1 kit 0   . ferrous sulfate 325 (65 FE) MG EC tablet Take 325 mg by mouth 3 (three) times daily with meals.     . folic acid (FOLVITE) 1 MG tablet Take 1 tablet by mouth once daily 90 tablet 0   . lamoTRIgine (LAMICTAL) 100 MG tablet Take 2 tablets twice a day 120 tablet 11   . Multiple Vitamin (MULTIVITAMIN) tablet Take 1 tablet by mouth daily.       Review of Systems  Constitutional: Negative.   HENT: Negative.   Eyes: Negative.   Respiratory: Negative.   Cardiovascular: Negative.   Gastrointestinal: Negative.   Endocrine: Negative.   Genitourinary: Negative.   Musculoskeletal: Negative.   Skin: Negative.   Allergic/Immunologic: Negative.   Neurological: Positive for seizures (at home @ 0300 -- witnessed by partner).  Hematological: Negative.   Psychiatric/Behavioral: Negative.    Physical Exam   Patient Vitals for  the past 24 hrs:  BP Temp Temp src Pulse Resp SpO2  06/26/19 1700 123/73 -- -- (!) 105 -- --  06/26/19 1630 113/72 -- -- (!) 117 -- --  06/26/19 1615 114/65 -- -- 98 -- --  06/26/19 1600 130/73 -- -- (!) 106 -- --  06/26/19 1545 120/70 -- -- (!) 115 -- --  06/26/19 1530 127/80 -- -- (!) 111 -- --  06/26/19 1430 123/73 -- -- (!) 106 -- --  06/26/19 1415 125/67 -- -- (!) 104 -- --  06/26/19 1401 127/78 -- -- (!) 111 -- --  06/26/19 1359 118/87 -- -- (!) 115 -- --  06/26/19 1314 -- 98.5 F (36.9 C) Oral -- 18 97 %  06/26/19 1312 121/71 -- -- (!) 106 -- --    Physical Exam  Nursing note and vitals reviewed. Constitutional: She is oriented to person, place, and time. She  appears well-developed and well-nourished.  HENT:  Head: Normocephalic and atraumatic.  Eyes: Pupils are equal, round, and reactive to light.  Cardiovascular: Normal rate and regular rhythm.  Respiratory: Effort normal.  GI: Soft.  Genitourinary:    Genitourinary Comments: Pelvic deferred   Musculoskeletal:        General: Normal range of motion.     Cervical back: Normal range of motion.  Neurological: She is alert and oriented to person, place, and time.  Skin: Skin is warm and dry.  Psychiatric: She has a normal mood and affect. Her behavior is normal. Judgment and thought content normal.   NST - FHR: 140 bpm / moderate variability / accels present / decels absent / TOCO: regular every 1.5-3 mins -- Reassessment @ 1710: no UC's noted  MAU Course  Procedures  MDM CCUA CBC CMP P/C Ratio Serial BP's   Results for orders placed or performed during the hospital encounter of 06/26/19 (from the past 24 hour(s))  CBC     Status: Abnormal   Collection Time: 06/26/19  2:33 PM  Result Value Ref Range   WBC 14.7 (H) 4.0 - 10.5 K/uL   RBC 4.28 3.87 - 5.11 MIL/uL   Hemoglobin 12.3 12.0 - 15.0 g/dL   HCT 37.5 36.0 - 46.0 %   MCV 87.6 80.0 - 100.0 fL   MCH 28.7 26.0 - 34.0 pg   MCHC 32.8 30.0 - 36.0 g/dL   RDW 14.6 11.5 - 15.5 %   Platelets 240 150 - 400 K/uL   nRBC 0.0 0.0 - 0.2 %  Comprehensive metabolic panel     Status: Abnormal   Collection Time: 06/26/19  2:33 PM  Result Value Ref Range   Sodium 139 135 - 145 mmol/L   Potassium 4.0 3.5 - 5.1 mmol/L   Chloride 108 98 - 111 mmol/L   CO2 21 (L) 22 - 32 mmol/L   Glucose, Bld 105 (H) 70 - 99 mg/dL   BUN 5 (L) 6 - 20 mg/dL   Creatinine, Ser 0.74 0.44 - 1.00 mg/dL   Calcium 8.8 (L) 8.9 - 10.3 mg/dL   Total Protein 5.7 (L) 6.5 - 8.1 g/dL   Albumin 2.6 (L) 3.5 - 5.0 g/dL   AST 23 15 - 41 U/L   ALT 24 0 - 44 U/L   Alkaline Phosphatase 122 38 - 126 U/L   Total Bilirubin 0.5 0.3 - 1.2 mg/dL   GFR calc non Af Amer >60 >60  mL/min   GFR calc Af Amer >60 >60 mL/min   Anion gap 10 5 - 15  Urinalysis, Routine w reflex microscopic     Status: Abnormal   Collection Time: 06/26/19  3:15 PM  Result Value Ref Range   Color, Urine YELLOW YELLOW   APPearance HAZY (A) CLEAR   Specific Gravity, Urine 1.016 1.005 - 1.030   pH 6.0 5.0 - 8.0   Glucose, UA NEGATIVE NEGATIVE mg/dL   Hgb urine dipstick NEGATIVE NEGATIVE   Bilirubin Urine NEGATIVE NEGATIVE   Ketones, ur 20 (A) NEGATIVE mg/dL   Protein, ur NEGATIVE NEGATIVE mg/dL   Nitrite NEGATIVE NEGATIVE   Leukocytes,Ua TRACE (A) NEGATIVE   RBC / HPF 0-5 0 - 5 RBC/hpf   WBC, UA 0-5 0 - 5 WBC/hpf   Bacteria, UA MANY (A) NONE SEEN   Squamous Epithelial / LPF 6-10 0 - 5   Mucus PRESENT   Protein / creatinine ratio, urine     Status: Abnormal   Collection Time: 06/26/19  3:15 PM  Result Value Ref Range   Creatinine, Urine 110.40 mg/dL   Total Protein, Urine 20 mg/dL   Protein Creatinine Ratio 0.18 (H) 0.00 - 0.15 mg/mg[Cre]      Assessment and Plan  Seizure disorder during pregnancy, antepartum  - Please f/u with Dr. Delice Lesch next week after seizure - Reassurance given that baby's well-being is good on today's exam - Discharge patient - Keep scheduled appt with [place] - Patient verbalized an understanding of the plan of care and agrees.     Laury Deep, MSN, CNM 06/26/2019, 1:35 PM

## 2019-06-26 NOTE — Discharge Instructions (Signed)
Please make sure you follow-up with Dr. Delice Lesch next week. She may want to adjust your Lamictal dosing.

## 2019-06-28 ENCOUNTER — Other Ambulatory Visit: Payer: Self-pay | Admitting: Neurology

## 2019-06-28 DIAGNOSIS — G40009 Localization-related (focal) (partial) idiopathic epilepsy and epileptic syndromes with seizures of localized onset, not intractable, without status epilepticus: Secondary | ICD-10-CM

## 2019-06-28 MED ORDER — LAMOTRIGINE 100 MG PO TABS: ORAL_TABLET | ORAL | 11 refills | Status: DC

## 2019-07-05 ENCOUNTER — Ambulatory Visit (HOSPITAL_COMMUNITY): Payer: BC Managed Care – PPO | Admitting: *Deleted

## 2019-07-05 ENCOUNTER — Ambulatory Visit (HOSPITAL_COMMUNITY)
Admission: RE | Admit: 2019-07-05 | Discharge: 2019-07-05 | Disposition: A | Discharge status: Home / Self Care | Payer: BC Managed Care – PPO | Source: Ambulatory Visit | Attending: Obstetrics and Gynecology | Admitting: Obstetrics and Gynecology

## 2019-07-05 ENCOUNTER — Encounter (HOSPITAL_COMMUNITY): Payer: Self-pay | Admitting: *Deleted

## 2019-07-05 ENCOUNTER — Other Ambulatory Visit (HOSPITAL_COMMUNITY): Payer: Self-pay | Admitting: *Deleted

## 2019-07-05 ENCOUNTER — Other Ambulatory Visit: Payer: BC Managed Care – PPO

## 2019-07-05 ENCOUNTER — Other Ambulatory Visit: Payer: Self-pay

## 2019-07-05 DIAGNOSIS — O09293 Supervision of pregnancy with other poor reproductive or obstetric history, third trimester: Secondary | ICD-10-CM | POA: Insufficient documentation

## 2019-07-05 DIAGNOSIS — O3413 Maternal care for benign tumor of corpus uteri, third trimester: Secondary | ICD-10-CM | POA: Diagnosis present

## 2019-07-05 DIAGNOSIS — Z3A36 36 weeks gestation of pregnancy: Secondary | ICD-10-CM | POA: Diagnosis not present

## 2019-07-05 DIAGNOSIS — G40009 Localization-related (focal) (partial) idiopathic epilepsy and epileptic syndromes with seizures of localized onset, not intractable, without status epilepticus: Secondary | ICD-10-CM

## 2019-07-05 DIAGNOSIS — O09523 Supervision of elderly multigravida, third trimester: Secondary | ICD-10-CM

## 2019-07-05 DIAGNOSIS — Z362 Encounter for other antenatal screening follow-up: Secondary | ICD-10-CM

## 2019-07-05 DIAGNOSIS — Z3490 Encounter for supervision of normal pregnancy, unspecified, unspecified trimester: Secondary | ICD-10-CM

## 2019-07-05 DIAGNOSIS — D259 Leiomyoma of uterus, unspecified: Secondary | ICD-10-CM

## 2019-07-08 ENCOUNTER — Other Ambulatory Visit: Payer: Self-pay

## 2019-07-08 ENCOUNTER — Ambulatory Visit (INDEPENDENT_AMBULATORY_CARE_PROVIDER_SITE_OTHER): Payer: BC Managed Care – PPO | Admitting: Family Medicine

## 2019-07-08 ENCOUNTER — Encounter: Payer: Self-pay | Admitting: Family Medicine

## 2019-07-08 VITALS — BP 118/82 | HR 99 | Wt 247.8 lb

## 2019-07-08 DIAGNOSIS — Z113 Encounter for screening for infections with a predominantly sexual mode of transmission: Secondary | ICD-10-CM

## 2019-07-08 DIAGNOSIS — Z3A36 36 weeks gestation of pregnancy: Secondary | ICD-10-CM

## 2019-07-08 DIAGNOSIS — O99353 Diseases of the nervous system complicating pregnancy, third trimester: Secondary | ICD-10-CM

## 2019-07-08 DIAGNOSIS — G40909 Epilepsy, unspecified, not intractable, without status epilepticus: Secondary | ICD-10-CM

## 2019-07-08 DIAGNOSIS — O34219 Maternal care for unspecified type scar from previous cesarean delivery: Secondary | ICD-10-CM

## 2019-07-08 DIAGNOSIS — O0990 Supervision of high risk pregnancy, unspecified, unspecified trimester: Secondary | ICD-10-CM

## 2019-07-08 DIAGNOSIS — O0993 Supervision of high risk pregnancy, unspecified, third trimester: Secondary | ICD-10-CM

## 2019-07-08 LAB — LAMOTRIGINE LEVEL: Lamotrigine Lvl: 8.7 ug/mL (ref 4.0–18.0)

## 2019-07-08 NOTE — Progress Notes (Signed)
   PRENATAL VISIT NOTE  Subjective:  Anne Benjamin is a 39 y.o. G3P1011 at [redacted]w[redacted]d being seen today for ongoing prenatal care.  She is currently monitored for the following issues for this high-risk pregnancy and has Seizure ([place]); Localization-related idiopathic epilepsy and epileptic syndromes with seizures of localized onset, not intractable, without status epilepticus ([place]); Fibroids; History of cesarean delivery affecting pregnancy; Supervision of high risk pregnancy, antepartum; Maternal morbid obesity, antepartum ([place]); Multigravida of advanced maternal age; Seizure disorder during pregnancy, antepartum ([place]); Alpha thalassemia silent carrier; History of pre-eclampsia in prior pregnancy, currently pregnant in third trimester; and History of penicillin allergy on their problem list.  Patient reports no complaints and recent seizure, meds adjusted up per neurology.  Contractions: Not present.  .  Movement: Present. Denies leaking of fluid.   The following portions of the patient's history were reviewed and updated as appropriate: allergies, current medications, past family history, past medical history, past social history, past surgical history and problem list.   Objective:   Vitals:   07/08/19 0947  BP: 118/82  Pulse: 99  Weight: 247 lb 12.8 oz (112.4 kg)    Fetal Status: Fetal Heart Rate (bpm): 138 Fundal Height: 36 cm Movement: Present     General:  Alert, oriented and cooperative. Patient is in no acute distress.  Skin: Skin is warm and dry. No rash noted.   Cardiovascular: Normal heart rate noted  Respiratory: Normal respiratory effort, no problems with respiration noted  Abdomen: Soft, gravid, appropriate for gestational age.  Pain/Pressure: Absent     Pelvic: Cervical exam performed Dilation: Closed Effacement (%): Thick Station: Ballotable  Extremities: Normal range of motion.  Edema: None  Mental Status: Normal mood and affect. Normal behavior. Normal judgment and thought  content.   Assessment and Plan:  Pregnancy: G3P1011 at [redacted]w[redacted]d 1. Seizure disorder during pregnancy, antepartum ([place]) On Lamictal, and levels may have dropped due to pregnancy--seen by Neurology  2. History of cesarean delivery affecting pregnancy For TOLAC--labor precautions reviewed  3. Supervision of high risk pregnancy, antepartum Cultures today PCN allergic. - GC/Chlamydia probe amp ([place])not at [facility] - Strep Gp B Culture+Rflx  Preterm labor symptoms and general obstetric precautions including but not limited to vaginal bleeding, contractions, leaking of fluid and fetal movement were reviewed in detail with the patient. Please refer to After Visit Summary for other counseling recommendations.   Return in 1 week (on 07/15/2019) for virtual.  Future Appointments  Date Time Provider [place]  07/12/2019  3:45 PM [place] 5 WH-MFCUS MFC-US  07/12/2019  4:00 PM [place] NURSE [place] MFC-US  07/19/2019 12:45 PM [place] MFC-US  07/19/2019 12:45 PM [place] 5 WH-MFCUS MFC-US  07/26/2019  2:00 PM [place] NURSE [place] MFC-US  07/26/2019  2:00 PM [place] 3 WH-MFCUS MFC-US  10/21/2019 11:30 AM Cameron Sprang, MD LBN-LBNG None    Donnamae Jude, MD

## 2019-07-08 NOTE — Patient Instructions (Signed)

## 2019-07-09 ENCOUNTER — Telehealth: Payer: Self-pay

## 2019-07-09 LAB — GC/CHLAMYDIA PROBE AMP (~~LOC~~) NOT AT ARMC
Chlamydia: NEGATIVE
Comment: NEGATIVE
Comment: NORMAL
Neisseria Gonorrhea: NEGATIVE

## 2019-07-09 NOTE — Telephone Encounter (Signed)
Pt called given her Lamictal level looks good, very close to her pre-pregnancy level, which is our goal. How is she feeling? Stated she felt good, taken her Lamictal 3 tabs BID

## 2019-07-10 ENCOUNTER — Other Ambulatory Visit: Payer: Self-pay | Admitting: Neurology

## 2019-07-12 ENCOUNTER — Ambulatory Visit (HOSPITAL_COMMUNITY): Payer: BC Managed Care – PPO

## 2019-07-12 ENCOUNTER — Ambulatory Visit (HOSPITAL_COMMUNITY): Payer: BC Managed Care – PPO | Admitting: *Deleted

## 2019-07-12 ENCOUNTER — Encounter (HOSPITAL_COMMUNITY): Payer: Self-pay | Admitting: *Deleted

## 2019-07-12 ENCOUNTER — Ambulatory Visit (HOSPITAL_COMMUNITY)
Admission: RE | Admit: 2019-07-12 | Discharge: 2019-07-12 | Disposition: A | Discharge status: Home / Self Care | Payer: BC Managed Care – PPO | Source: Ambulatory Visit | Attending: Obstetrics | Admitting: Obstetrics

## 2019-07-12 ENCOUNTER — Other Ambulatory Visit: Payer: Self-pay

## 2019-07-12 DIAGNOSIS — Z3A37 37 weeks gestation of pregnancy: Secondary | ICD-10-CM | POA: Diagnosis not present

## 2019-07-12 DIAGNOSIS — O09523 Supervision of elderly multigravida, third trimester: Secondary | ICD-10-CM | POA: Diagnosis present

## 2019-07-12 DIAGNOSIS — O09293 Supervision of pregnancy with other poor reproductive or obstetric history, third trimester: Secondary | ICD-10-CM | POA: Insufficient documentation

## 2019-07-12 LAB — STREP GP B CULTURE+RFLX: Strep Gp B Culture+Rflx: NEGATIVE

## 2019-07-19 ENCOUNTER — Other Ambulatory Visit: Payer: Self-pay

## 2019-07-19 ENCOUNTER — Ambulatory Visit (HOSPITAL_COMMUNITY): Payer: BC Managed Care – PPO | Admitting: *Deleted

## 2019-07-19 ENCOUNTER — Encounter (HOSPITAL_COMMUNITY): Payer: Self-pay

## 2019-07-19 ENCOUNTER — Encounter: Payer: Self-pay | Admitting: Family Medicine

## 2019-07-19 ENCOUNTER — Telehealth (INDEPENDENT_AMBULATORY_CARE_PROVIDER_SITE_OTHER): Payer: BC Managed Care – PPO | Admitting: Family Medicine

## 2019-07-19 ENCOUNTER — Ambulatory Visit (HOSPITAL_COMMUNITY)
Admission: RE | Admit: 2019-07-19 | Discharge: 2019-07-19 | Disposition: A | Discharge status: Home / Self Care | Payer: BC Managed Care – PPO | Source: Ambulatory Visit | Attending: Obstetrics and Gynecology | Admitting: Obstetrics and Gynecology

## 2019-07-19 VITALS — BP 121/72

## 2019-07-19 DIAGNOSIS — Z3A38 38 weeks gestation of pregnancy: Secondary | ICD-10-CM

## 2019-07-19 DIAGNOSIS — O09299 Supervision of pregnancy with other poor reproductive or obstetric history, unspecified trimester: Secondary | ICD-10-CM

## 2019-07-19 DIAGNOSIS — Z362 Encounter for other antenatal screening follow-up: Secondary | ICD-10-CM

## 2019-07-19 DIAGNOSIS — O99213 Obesity complicating pregnancy, third trimester: Secondary | ICD-10-CM

## 2019-07-19 DIAGNOSIS — Z148 Genetic carrier of other disease: Secondary | ICD-10-CM

## 2019-07-19 DIAGNOSIS — O09523 Supervision of elderly multigravida, third trimester: Secondary | ICD-10-CM

## 2019-07-19 DIAGNOSIS — O99210 Obesity complicating pregnancy, unspecified trimester: Secondary | ICD-10-CM | POA: Diagnosis not present

## 2019-07-19 DIAGNOSIS — G40909 Epilepsy, unspecified, not intractable, without status epilepticus: Secondary | ICD-10-CM

## 2019-07-19 DIAGNOSIS — O0990 Supervision of high risk pregnancy, unspecified, unspecified trimester: Secondary | ICD-10-CM

## 2019-07-19 DIAGNOSIS — O34219 Maternal care for unspecified type scar from previous cesarean delivery: Secondary | ICD-10-CM

## 2019-07-19 DIAGNOSIS — O09293 Supervision of pregnancy with other poor reproductive or obstetric history, third trimester: Secondary | ICD-10-CM

## 2019-07-19 DIAGNOSIS — O3410 Maternal care for benign tumor of corpus uteri, unspecified trimester: Secondary | ICD-10-CM

## 2019-07-19 DIAGNOSIS — O99350 Diseases of the nervous system complicating pregnancy, unspecified trimester: Secondary | ICD-10-CM

## 2019-07-19 NOTE — Progress Notes (Signed)
I connected with@ on 07/19/19 at  4:15 PM EDT by: Mychart and verified that I am speaking with the correct person using two identifiers.  Patient is located on her car and provider is located at [facility].     The purpose of this virtual visit is to provide medical care while limiting exposure to the novel coronavirus. I discussed the limitations, risks, security and privacy concerns of performing an evaluation and management service by Mychart and the availability of in person appointments. I also discussed with the patient that there may be a patient responsible charge related to this service. By engaging in this virtual visit, you consent to the provision of healthcare.  Additionally, you authorize for your insurance to be billed for the services provided during this visit.  The patient expressed understanding and agreed to proceed.  The following staff members participated in the virtual visit:  Crosby Oyster    PRENATAL VISIT NOTE  Subjective:  Anne Benjamin is a 39 y.o. G3P1011 at [redacted]w[redacted]d  for phone visit for ongoing prenatal care.  She is currently monitored for the following issues for this high-risk pregnancy and has Seizure ([place]); Localization-related idiopathic epilepsy and epileptic syndromes with seizures of localized onset, not intractable, without status epilepticus ([place]); Fibroids; History of cesarean delivery affecting pregnancy; Supervision of high risk pregnancy, antepartum; Maternal morbid obesity, antepartum ([place]); Multigravida of advanced maternal age; Seizure disorder during pregnancy, antepartum ([place]); Alpha thalassemia silent carrier; History of pre-eclampsia in prior pregnancy, currently pregnant in third trimester; and History of penicillin allergy on their problem list.  Patient reports no complaints.  Contractions: Not present. Vag. Bleeding: None.  Movement: Present. Denies leaking of fluid.   The following portions of the patient's history were reviewed and updated as  appropriate: allergies, current medications, past family history, past medical history, past social history, past surgical history and problem list.   Objective:   Vitals:   07/19/19 1533  BP: 121/72   Self-Obtained  Fetal Status:     Movement: Present     Assessment and Plan:  Pregnancy: G3P1011 at [redacted]w[redacted]d 1. History of pre-eclampsia in prior pregnancy, currently pregnant in third trimester BP WNL  2. Maternal morbid obesity, antepartum (HCC) TWG=32 lb 12.8 oz (14.9 kg)   3. Supervision of high risk pregnancy, antepartum Up to date Reviewed pregnancy box and updated Thinking about OCP for pregnancy prevention Knows when to go to LD IOL on 08/02/2019 at EDD given age=38yo  4. History of cesarean delivery affecting pregnancy TOLAC desired Reviewed IOl in light of TOLAC  5. Seizures Well controlled.    Term labor symptoms and general obstetric precautions including but not limited to vaginal bleeding, contractions, leaking of fluid and fetal movement were reviewed in detail with the patient.  Return in about 1 week (around 07/26/2019) for Routine prenatal care, Scheduled prenatal/NST.  Future Appointments  Date Time Provider [place]  07/19/2019  4:15 PM Caren Macadam, MD [facility]  07/26/2019  2:00 PM [place] NURSE [place] MFC-US  07/26/2019  2:00 PM [place] 3 WH-MFCUS MFC-US  07/28/2019 10:45 AM Caren Macadam, MD [facility]  10/21/2019 11:30 AM Cameron Sprang, MD LBN-LBNG None     Time spent on virtual visit: 12 minutes  Caren Macadam, MD

## 2019-07-19 NOTE — Progress Notes (Signed)
I connected with  Anne Benjamin on 07/19/19 at  4:15 PM EDT by telephone and verified that I am speaking with the correct person using two identifiers.   I discussed the limitations, risks, security and privacy concerns of performing an evaluation and management service by telephone and the availability of in person appointments. I also discussed with the patient that there may be a patient responsible charge related to this service. The patient expressed understanding and agreed to proceed.  Crosby Oyster, RN 07/19/2019  3:34 PM   Discuss a birth/delivery plan

## 2019-07-19 NOTE — Addendum Note (Signed)
Addended by: Caryl Bis on: 07/19/2019 04:57 PM   Modules accepted: Orders, SmartSet

## 2019-07-21 ENCOUNTER — Telehealth (HOSPITAL_COMMUNITY): Payer: Self-pay | Admitting: *Deleted

## 2019-07-21 NOTE — Telephone Encounter (Signed)
Preadmission screen  

## 2019-07-22 ENCOUNTER — Telehealth (HOSPITAL_COMMUNITY): Payer: Self-pay | Admitting: *Deleted

## 2019-07-22 NOTE — Telephone Encounter (Signed)
Preadmission screen  

## 2019-07-23 ENCOUNTER — Telehealth (HOSPITAL_COMMUNITY): Payer: Self-pay | Admitting: *Deleted

## 2019-07-23 ENCOUNTER — Encounter (HOSPITAL_COMMUNITY): Payer: Self-pay | Admitting: *Deleted

## 2019-07-23 NOTE — Telephone Encounter (Signed)
Preadmission screen  

## 2019-07-26 ENCOUNTER — Ambulatory Visit (HOSPITAL_COMMUNITY)
Admission: RE | Admit: 2019-07-26 | Discharge: 2019-07-26 | Disposition: A | Discharge status: Home / Self Care | Payer: BC Managed Care – PPO | Source: Ambulatory Visit | Attending: Obstetrics and Gynecology | Admitting: Obstetrics and Gynecology

## 2019-07-26 ENCOUNTER — Encounter (HOSPITAL_COMMUNITY): Payer: Self-pay

## 2019-07-26 ENCOUNTER — Other Ambulatory Visit: Payer: Self-pay

## 2019-07-26 ENCOUNTER — Ambulatory Visit (HOSPITAL_COMMUNITY): Payer: BC Managed Care – PPO | Admitting: *Deleted

## 2019-07-26 DIAGNOSIS — Z3A39 39 weeks gestation of pregnancy: Secondary | ICD-10-CM

## 2019-07-26 DIAGNOSIS — O09299 Supervision of pregnancy with other poor reproductive or obstetric history, unspecified trimester: Secondary | ICD-10-CM

## 2019-07-26 DIAGNOSIS — Z148 Genetic carrier of other disease: Secondary | ICD-10-CM

## 2019-07-26 DIAGNOSIS — O99210 Obesity complicating pregnancy, unspecified trimester: Secondary | ICD-10-CM

## 2019-07-26 DIAGNOSIS — O99350 Diseases of the nervous system complicating pregnancy, unspecified trimester: Secondary | ICD-10-CM

## 2019-07-26 DIAGNOSIS — O3410 Maternal care for benign tumor of corpus uteri, unspecified trimester: Secondary | ICD-10-CM

## 2019-07-26 DIAGNOSIS — O09523 Supervision of elderly multigravida, third trimester: Secondary | ICD-10-CM | POA: Diagnosis not present

## 2019-07-26 DIAGNOSIS — O09293 Supervision of pregnancy with other poor reproductive or obstetric history, third trimester: Secondary | ICD-10-CM

## 2019-07-26 DIAGNOSIS — O34219 Maternal care for unspecified type scar from previous cesarean delivery: Secondary | ICD-10-CM

## 2019-07-26 DIAGNOSIS — G40909 Epilepsy, unspecified, not intractable, without status epilepticus: Secondary | ICD-10-CM

## 2019-07-28 ENCOUNTER — Telehealth (INDEPENDENT_AMBULATORY_CARE_PROVIDER_SITE_OTHER): Payer: BC Managed Care – PPO | Admitting: Family Medicine

## 2019-07-28 ENCOUNTER — Other Ambulatory Visit: Payer: Self-pay

## 2019-07-28 ENCOUNTER — Other Ambulatory Visit: Payer: Self-pay | Admitting: Family Medicine

## 2019-07-28 DIAGNOSIS — O09293 Supervision of pregnancy with other poor reproductive or obstetric history, third trimester: Secondary | ICD-10-CM

## 2019-07-28 DIAGNOSIS — O99210 Obesity complicating pregnancy, unspecified trimester: Secondary | ICD-10-CM

## 2019-07-28 DIAGNOSIS — Z3A39 39 weeks gestation of pregnancy: Secondary | ICD-10-CM

## 2019-07-28 DIAGNOSIS — O0990 Supervision of high risk pregnancy, unspecified, unspecified trimester: Secondary | ICD-10-CM

## 2019-07-28 DIAGNOSIS — O09523 Supervision of elderly multigravida, third trimester: Secondary | ICD-10-CM

## 2019-07-28 NOTE — Progress Notes (Signed)
I connected with  Phillipa Modeste on 07/28/19 at 10:45 AM EDT by telephone and verified that I am speaking with the correct person using two identifiers.   I discussed the limitations, risks, security and privacy concerns of performing an evaluation and management service by telephone and the availability of in person appointments. I also discussed with the patient that there may be a patient responsible charge related to this service. The patient expressed understanding and agreed to proceed.  Crosby Oyster, RN 07/28/2019  11:01 AM

## 2019-07-28 NOTE — Progress Notes (Signed)
I connected with@ on 07/28/19 at 10:45 AM EDT by: Mychart and verified that I am speaking with the correct person using two identifiers.  Patient is located at home and provider is located at [facility].     The purpose of this virtual visit is to provide medical care while limiting exposure to the novel coronavirus. I discussed the limitations, risks, security and privacy concerns of performing an evaluation and management service by Mychart and the availability of in person appointments. I also discussed with the patient that there may be a patient responsible charge related to this service. By engaging in this virtual visit, you consent to the provision of healthcare.  Additionally, you authorize for your insurance to be billed for the services provided during this visit.  The patient expressed understanding and agreed to proceed.  The following staff members participated in the virtual visit:  Crosby Oyster RN    PRENATAL VISIT NOTE  Subjective:  Anne Benjamin is a 39 y.o. G3P1011 at [redacted]w[redacted]d  for phone visit for ongoing prenatal care.  She is currently monitored for the following issues for this high-risk pregnancy and has Seizure ([place]); Localization-related idiopathic epilepsy and epileptic syndromes with seizures of localized onset, not intractable, without status epilepticus ([place]); Fibroids; History of cesarean delivery affecting pregnancy; Supervision of high risk pregnancy, antepartum; Maternal morbid obesity, antepartum ([place]); Multigravida of advanced maternal age; Seizure disorder during pregnancy, antepartum ([place]); Alpha thalassemia silent carrier; History of pre-eclampsia in prior pregnancy, currently pregnant in third trimester; and History of penicillin allergy on their problem list.  Patient reports no complaints.  Contractions: Not present. Vag. Bleeding: None.  Movement: Present. Denies leaking of fluid.   The following portions of the patient's history were reviewed and updated as  appropriate: allergies, current medications, past family history, past medical history, past social history, past surgical history and problem list.   Objective:  There were no vitals filed for this visit. Self-Obtained  Fetal Status:     Movement: Present     Assessment and Plan:  Pregnancy: G3P1011 at [redacted]w[redacted]d 1. History of pre-eclampsia in prior pregnancy, currently pregnant in third trimester BP WNL today  2. Supervision of high risk pregnancy, antepartum Up to date Has IOL scheduled, reviewed process and what to expect with IOL  3. Maternal morbid obesity, antepartum (HCC) TWG=32 lb 12.8 oz (14.9 kg) which is above goal and excessive for this pregnancy  4. Multigravida of advanced maternal age in third trimester NIP low risk   Term labor symptoms and general obstetric precautions including but not limited to vaginal bleeding, contractions, leaking of fluid and fetal movement were reviewed in detail with the patient.  Return in about 6 weeks (around 09/08/2019) for Postpartum visit.  Future Appointments  Date Time Provider [place]  07/31/2019  9:30 AM MC-SCREENING MC-SDSC None  08/02/2019 12:00 AM MC-LD SCHED ROOM MC-INDC None  09/06/2019  1:45 PM Aletha Halim, MD [facility]  10/21/2019 11:30 AM Cameron Sprang, MD LBN-LBNG None     Time spent on virtual visit: 15 minutes  Caren Macadam, MD

## 2019-07-31 ENCOUNTER — Other Ambulatory Visit (HOSPITAL_COMMUNITY)
Admission: RE | Admit: 2019-07-31 | Discharge: 2019-07-31 | Disposition: A | Discharge status: Home / Self Care | Payer: BC Managed Care – PPO | Source: Ambulatory Visit | Attending: Family Medicine | Admitting: Family Medicine

## 2019-07-31 DIAGNOSIS — Z01812 Encounter for preprocedural laboratory examination: Secondary | ICD-10-CM | POA: Insufficient documentation

## 2019-07-31 DIAGNOSIS — Z20822 Contact with and (suspected) exposure to covid-19: Secondary | ICD-10-CM | POA: Diagnosis not present

## 2019-07-31 LAB — SARS CORONAVIRUS 2 (TAT 6-24 HRS): SARS Coronavirus 2: NEGATIVE

## 2019-08-02 ENCOUNTER — Other Ambulatory Visit: Payer: Self-pay

## 2019-08-02 ENCOUNTER — Inpatient Hospital Stay (HOSPITAL_COMMUNITY): Payer: BC Managed Care – PPO | Admitting: Anesthesiology

## 2019-08-02 ENCOUNTER — Inpatient Hospital Stay (HOSPITAL_COMMUNITY)
Admission: AD | Admit: 2019-08-02 | Discharge: 2019-08-05 | DRG: 787 | Disposition: A | Discharge status: Home / Self Care | Payer: BC Managed Care – PPO | Attending: Obstetrics & Gynecology | Admitting: Obstetrics & Gynecology

## 2019-08-02 ENCOUNTER — Encounter (HOSPITAL_COMMUNITY)
Admission: AD | Disposition: A | Discharge status: Home / Self Care | Payer: Self-pay | Source: Home / Self Care | Attending: Obstetrics & Gynecology

## 2019-08-02 ENCOUNTER — Inpatient Hospital Stay (HOSPITAL_COMMUNITY): Payer: BC Managed Care – PPO

## 2019-08-02 ENCOUNTER — Encounter (HOSPITAL_COMMUNITY): Payer: Self-pay | Admitting: Obstetrics & Gynecology

## 2019-08-02 DIAGNOSIS — O3413 Maternal care for benign tumor of corpus uteri, third trimester: Secondary | ICD-10-CM | POA: Diagnosis present

## 2019-08-02 DIAGNOSIS — O09293 Supervision of pregnancy with other poor reproductive or obstetric history, third trimester: Secondary | ICD-10-CM

## 2019-08-02 DIAGNOSIS — G40909 Epilepsy, unspecified, not intractable, without status epilepticus: Secondary | ICD-10-CM | POA: Diagnosis present

## 2019-08-02 DIAGNOSIS — O9081 Anemia of the puerperium: Secondary | ICD-10-CM | POA: Diagnosis not present

## 2019-08-02 DIAGNOSIS — O09529 Supervision of elderly multigravida, unspecified trimester: Secondary | ICD-10-CM

## 2019-08-02 DIAGNOSIS — D563 Thalassemia minor: Secondary | ICD-10-CM | POA: Diagnosis present

## 2019-08-02 DIAGNOSIS — O480 Post-term pregnancy: Secondary | ICD-10-CM

## 2019-08-02 DIAGNOSIS — O99214 Obesity complicating childbirth: Secondary | ICD-10-CM | POA: Diagnosis present

## 2019-08-02 DIAGNOSIS — O134 Gestational [pregnancy-induced] hypertension without significant proteinuria, complicating childbirth: Secondary | ICD-10-CM | POA: Diagnosis present

## 2019-08-02 DIAGNOSIS — D259 Leiomyoma of uterus, unspecified: Secondary | ICD-10-CM | POA: Diagnosis present

## 2019-08-02 DIAGNOSIS — D219 Benign neoplasm of connective and other soft tissue, unspecified: Secondary | ICD-10-CM | POA: Diagnosis present

## 2019-08-02 DIAGNOSIS — D62 Acute posthemorrhagic anemia: Secondary | ICD-10-CM | POA: Diagnosis not present

## 2019-08-02 DIAGNOSIS — O26893 Other specified pregnancy related conditions, third trimester: Secondary | ICD-10-CM | POA: Diagnosis present

## 2019-08-02 DIAGNOSIS — Z880 Allergy status to penicillin: Secondary | ICD-10-CM | POA: Diagnosis not present

## 2019-08-02 DIAGNOSIS — O99210 Obesity complicating pregnancy, unspecified trimester: Secondary | ICD-10-CM | POA: Diagnosis present

## 2019-08-02 DIAGNOSIS — Z3A40 40 weeks gestation of pregnancy: Secondary | ICD-10-CM

## 2019-08-02 DIAGNOSIS — O34211 Maternal care for low transverse scar from previous cesarean delivery: Principal | ICD-10-CM | POA: Diagnosis present

## 2019-08-02 DIAGNOSIS — O99354 Diseases of the nervous system complicating childbirth: Secondary | ICD-10-CM | POA: Diagnosis present

## 2019-08-02 DIAGNOSIS — Z98891 History of uterine scar from previous surgery: Secondary | ICD-10-CM

## 2019-08-02 DIAGNOSIS — O09523 Supervision of elderly multigravida, third trimester: Secondary | ICD-10-CM

## 2019-08-02 DIAGNOSIS — G40009 Localization-related (focal) (partial) idiopathic epilepsy and epileptic syndromes with seizures of localized onset, not intractable, without status epilepticus: Secondary | ICD-10-CM | POA: Diagnosis present

## 2019-08-02 LAB — COMPREHENSIVE METABOLIC PANEL
ALT: 19 U/L (ref 0–44)
AST: 22 U/L (ref 15–41)
Albumin: 2.3 g/dL — ABNORMAL LOW (ref 3.5–5.0)
Alkaline Phosphatase: 119 U/L (ref 38–126)
Anion gap: 12 (ref 5–15)
BUN: 8 mg/dL (ref 6–20)
CO2: 22 mmol/L (ref 22–32)
Calcium: 8.5 mg/dL — ABNORMAL LOW (ref 8.9–10.3)
Chloride: 101 mmol/L (ref 98–111)
Creatinine, Ser: 0.75 mg/dL (ref 0.44–1.00)
GFR calc Af Amer: >60 mL/min (ref >60)
GFR calc non Af Amer: >60 mL/min (ref >60)
Glucose, Bld: 90 mg/dL (ref 70–99)
Potassium: 4.7 mmol/L (ref 3.5–5.1)
Sodium: 135 mmol/L (ref 135–145)
Total Bilirubin: 0.6 mg/dL (ref 0.3–1.2)
Total Protein: 4.8 g/dL — ABNORMAL LOW (ref 6.5–8.1)

## 2019-08-02 LAB — CBC
HCT: 35.8 % — ABNORMAL LOW (ref 36.0–46.0)
HCT: 32.3 % — ABNORMAL LOW (ref 36.0–46.0)
Hemoglobin: 11.5 g/dL — ABNORMAL LOW (ref 12.0–15.0)
Hemoglobin: 10.3 g/dL — ABNORMAL LOW (ref 12.0–15.0)
MCH: 28.7 pg (ref 26.0–34.0)
MCH: 28.1 pg (ref 26.0–34.0)
MCHC: 32.1 g/dL (ref 30.0–36.0)
MCHC: 31.9 g/dL (ref 30.0–36.0)
MCV: 89.3 fL (ref 80.0–100.0)
MCV: 88 fL (ref 80.0–100.0)
Platelets: 259 10*3/uL (ref 150–400)
Platelets: 227 10*3/uL (ref 150–400)
RBC: 4.01 MIL/uL (ref 3.87–5.11)
RBC: 3.67 MIL/uL — ABNORMAL LOW (ref 3.87–5.11)
RDW: 14.8 % (ref 11.5–15.5)
RDW: 14.6 % (ref 11.5–15.5)
WBC: 11.6 10*3/uL — ABNORMAL HIGH (ref 4.0–10.5)
WBC: 13.5 10*3/uL — ABNORMAL HIGH (ref 4.0–10.5)
nRBC: 0 % (ref 0.0–0.2)
nRBC: 0 % (ref 0.0–0.2)

## 2019-08-02 LAB — PROTEIN / CREATININE RATIO, URINE
Creatinine, Urine: 104.2 mg/dL
Protein Creatinine Ratio: 4.91 mg/mg{Cre} — ABNORMAL HIGH (ref 0.00–0.15)
Total Protein, Urine: 512 mg/dL

## 2019-08-02 LAB — RPR: RPR Ser Ql: NONREACTIVE

## 2019-08-02 LAB — TYPE AND SCREEN
ABO/RH(D): A POS
Antibody Screen: NEGATIVE

## 2019-08-02 LAB — ABO/RH: ABO/RH(D): A POS

## 2019-08-02 SURGERY — Surgical Case
Anesthesia: Spinal

## 2019-08-02 MED ORDER — PHENYLEPHRINE HCL-NACL 20-0.9 MG/250ML-% IV SOLN
INTRAVENOUS | Status: DC | PRN
  Administered 2019-08-02: 30 ug/min via INTRAVENOUS

## 2019-08-02 MED ORDER — OXYTOCIN 40 UNITS IN NORMAL SALINE INFUSION - SIMPLE MED
INTRAVENOUS | Status: AC
  Filled 2019-08-02: qty 1000

## 2019-08-02 MED ORDER — PHENYLEPHRINE 40 MCG/ML (10ML) SYRINGE FOR IV PUSH (FOR BLOOD PRESSURE SUPPORT)
PREFILLED_SYRINGE | INTRAVENOUS | Status: AC
  Filled 2019-08-02: qty 10

## 2019-08-02 MED ORDER — OXYTOCIN 10 UNIT/ML IJ SOLN
INTRAMUSCULAR | Status: DC | PRN
  Administered 2019-08-02: 40 [IU] via INTRAMUSCULAR

## 2019-08-02 MED ORDER — NALBUPHINE HCL 10 MG/ML IJ SOLN: 5.0000 mg | INTRAMUSCULAR | Status: DC | PRN

## 2019-08-02 MED ORDER — HYDRALAZINE HCL 20 MG/ML IJ SOLN: 10.0000 mg | INTRAMUSCULAR | Status: DC | PRN

## 2019-08-02 MED ORDER — LACTATED RINGERS IV SOLN: 500.0000 mL | INTRAVENOUS | Status: DC | PRN

## 2019-08-02 MED ORDER — OXYTOCIN 40 UNITS IN NORMAL SALINE INFUSION - SIMPLE MED: 2.5000 [IU]/h | INTRAVENOUS | Status: AC

## 2019-08-02 MED ORDER — FERROUS SULFATE 325 (65 FE) MG PO TABS: 325.0000 mg | ORAL_TABLET | Freq: Two times a day (BID) | ORAL | Status: DC

## 2019-08-02 MED ORDER — FERROUS SULFATE 325 (65 FE) MG PO TBEC: 325.0000 mg | DELAYED_RELEASE_TABLET | Freq: Every day | ORAL | Status: DC

## 2019-08-02 MED ORDER — SENNOSIDES-DOCUSATE SODIUM 8.6-50 MG PO TABS
2.0000 | ORAL_TABLET | ORAL | Status: DC
  Administered 2019-08-03 – 2019-08-05 (×3): 2 via ORAL
  Filled 2019-08-02 (×3): qty 2

## 2019-08-02 MED ORDER — HYDROMORPHONE HCL 1 MG/ML IJ SOLN: 1.0000 mg | INTRAMUSCULAR | Status: DC | PRN

## 2019-08-02 MED ORDER — OXYTOCIN 40 UNITS IN NORMAL SALINE INFUSION - SIMPLE MED: 1.0000 m[IU]/min | INTRAVENOUS | Status: DC

## 2019-08-02 MED ORDER — PRENATAL MULTIVITAMIN CH
1.0000 | ORAL_TABLET | Freq: Every day | ORAL | Status: DC
  Administered 2019-08-03 – 2019-08-05 (×3): 1 via ORAL
  Filled 2019-08-02 (×3): qty 1

## 2019-08-02 MED ORDER — LIDOCAINE HCL (PF) 1 % IJ SOLN: 30.0000 mL | INTRAMUSCULAR | Status: DC | PRN

## 2019-08-02 MED ORDER — OXYTOCIN 40 UNITS IN NORMAL SALINE INFUSION - SIMPLE MED
1.0000 m[IU]/min | INTRAVENOUS | Status: DC
  Administered 2019-08-02: 02:00:00 2 m[IU]/min via INTRAVENOUS
  Administered 2019-08-02: 03:00:00 4 m[IU]/min via INTRAVENOUS
  Filled 2019-08-02: qty 1000

## 2019-08-02 MED ORDER — FENTANYL CITRATE (PF) 100 MCG/2ML IJ SOLN
INTRAMUSCULAR | Status: AC
  Filled 2019-08-02: qty 2

## 2019-08-02 MED ORDER — KETOROLAC TROMETHAMINE 30 MG/ML IJ SOLN
INTRAMUSCULAR | Status: AC
  Filled 2019-08-02: qty 1

## 2019-08-02 MED ORDER — SOD CITRATE-CITRIC ACID 500-334 MG/5ML PO SOLN
30.0000 mL | ORAL | Status: DC | PRN
  Administered 2019-08-02: 06:00:00 30 mL via ORAL
  Filled 2019-08-02: qty 30

## 2019-08-02 MED ORDER — MAGNESIUM HYDROXIDE 400 MG/5ML PO SUSP: 30.0000 mL | ORAL | Status: DC | PRN

## 2019-08-02 MED ORDER — DIPHENHYDRAMINE HCL 25 MG PO CAPS: 25.0000 mg | ORAL_CAPSULE | Freq: Four times a day (QID) | ORAL | Status: DC | PRN

## 2019-08-02 MED ORDER — KETOROLAC TROMETHAMINE 30 MG/ML IJ SOLN
30.0000 mg | Freq: Four times a day (QID) | INTRAMUSCULAR | Status: AC
  Administered 2019-08-02 – 2019-08-03 (×3): 30 mg via INTRAVENOUS
  Filled 2019-08-02 (×3): qty 1

## 2019-08-02 MED ORDER — GABAPENTIN 100 MG PO CAPS
300.0000 mg | ORAL_CAPSULE | Freq: Two times a day (BID) | ORAL | Status: DC
  Administered 2019-08-02 – 2019-08-05 (×7): 300 mg via ORAL
  Filled 2019-08-02 (×7): qty 3

## 2019-08-02 MED ORDER — WITCH HAZEL-GLYCERIN EX PADS: 1.0000 "application " | MEDICATED_PAD | CUTANEOUS | Status: DC | PRN

## 2019-08-02 MED ORDER — SCOPOLAMINE 1 MG/3DAYS TD PT72
1.0000 | MEDICATED_PATCH | Freq: Once | TRANSDERMAL | Status: AC
  Administered 2019-08-02: 07:00:00 1.5 mg via TRANSDERMAL

## 2019-08-02 MED ORDER — HYDRALAZINE HCL 20 MG/ML IJ SOLN: 5.0000 mg | INTRAMUSCULAR | Status: DC | PRN

## 2019-08-02 MED ORDER — LAMOTRIGINE 150 MG PO TABS
300.0000 mg | ORAL_TABLET | Freq: Two times a day (BID) | ORAL | Status: DC
  Administered 2019-08-02 – 2019-08-05 (×7): 300 mg via ORAL
  Filled 2019-08-02 (×8): qty 2

## 2019-08-02 MED ORDER — CLINDAMYCIN PHOSPHATE 900 MG/50ML IV SOLN
INTRAVENOUS | Status: AC
  Filled 2019-08-02: qty 50

## 2019-08-02 MED ORDER — DIPHENHYDRAMINE HCL 25 MG PO CAPS: 25.0000 mg | ORAL_CAPSULE | ORAL | Status: DC | PRN

## 2019-08-02 MED ORDER — MORPHINE SULFATE (PF) 0.5 MG/ML IJ SOLN
INTRAMUSCULAR | Status: AC
  Filled 2019-08-02: qty 10

## 2019-08-02 MED ORDER — OXYCODONE-ACETAMINOPHEN 5-325 MG PO TABS: 2.0000 | ORAL_TABLET | ORAL | Status: DC | PRN

## 2019-08-02 MED ORDER — ZOLPIDEM TARTRATE 5 MG PO TABS: 5.0000 mg | ORAL_TABLET | Freq: Every evening | ORAL | Status: DC | PRN

## 2019-08-02 MED ORDER — OXYCODONE HCL 5 MG PO TABS: 5.0000 mg | ORAL_TABLET | ORAL | Status: DC | PRN

## 2019-08-02 MED ORDER — SODIUM CHLORIDE 0.9 % IR SOLN
Status: DC | PRN
  Administered 2019-08-02: 1000 mL

## 2019-08-02 MED ORDER — LACTATED RINGERS IV SOLN: INTRAVENOUS | Status: DC | PRN

## 2019-08-02 MED ORDER — KETOROLAC TROMETHAMINE 30 MG/ML IJ SOLN: 30.0000 mg | Freq: Four times a day (QID) | INTRAMUSCULAR | Status: DC | PRN

## 2019-08-02 MED ORDER — LAMOTRIGINE 100 MG PO TABS: 200.0000 mg | ORAL_TABLET | Freq: Two times a day (BID) | ORAL | Status: DC

## 2019-08-02 MED ORDER — FENTANYL CITRATE (PF) 100 MCG/2ML IJ SOLN
INTRAMUSCULAR | Status: DC | PRN
  Administered 2019-08-02: 15 ug via INTRATHECAL

## 2019-08-02 MED ORDER — MORPHINE SULFATE (PF) 0.5 MG/ML IJ SOLN
INTRAMUSCULAR | Status: DC | PRN
  Administered 2019-08-02: .15 mg via INTRATHECAL

## 2019-08-02 MED ORDER — CLINDAMYCIN PHOSPHATE 900 MG/50ML IV SOLN
900.0000 mg | INTRAVENOUS | Status: AC
  Administered 2019-08-02: 900 mg via INTRAVENOUS

## 2019-08-02 MED ORDER — LABETALOL HCL 5 MG/ML IV SOLN: 20.0000 mg | INTRAVENOUS | Status: DC | PRN

## 2019-08-02 MED ORDER — DIPHENHYDRAMINE HCL 50 MG/ML IJ SOLN: 12.5000 mg | INTRAMUSCULAR | Status: DC | PRN

## 2019-08-02 MED ORDER — FENTANYL CITRATE (PF) 100 MCG/2ML IJ SOLN: 25.0000 ug | INTRAMUSCULAR | Status: DC | PRN

## 2019-08-02 MED ORDER — KETOROLAC TROMETHAMINE 30 MG/ML IJ SOLN: 30.0000 mg | Freq: Once | INTRAMUSCULAR | Status: DC

## 2019-08-02 MED ORDER — LACTATED RINGERS IV SOLN: INTRAVENOUS | Status: DC

## 2019-08-02 MED ORDER — LABETALOL HCL 5 MG/ML IV SOLN: 40.0000 mg | INTRAVENOUS | Status: DC | PRN

## 2019-08-02 MED ORDER — COCONUT OIL OIL: 1.0000 "application " | TOPICAL_OIL | Status: DC | PRN

## 2019-08-02 MED ORDER — IBUPROFEN 800 MG PO TABS
800.0000 mg | ORAL_TABLET | Freq: Four times a day (QID) | ORAL | Status: DC
  Administered 2019-08-03 – 2019-08-05 (×9): 800 mg via ORAL
  Filled 2019-08-02 (×9): qty 1

## 2019-08-02 MED ORDER — NALOXONE HCL 0.4 MG/ML IJ SOLN: 0.4000 mg | INTRAMUSCULAR | Status: DC | PRN

## 2019-08-02 MED ORDER — SCOPOLAMINE 1 MG/3DAYS TD PT72
MEDICATED_PATCH | TRANSDERMAL | Status: AC
  Filled 2019-08-02: qty 1

## 2019-08-02 MED ORDER — SIMETHICONE 80 MG PO CHEW: 80.0000 mg | CHEWABLE_TABLET | ORAL | Status: DC | PRN

## 2019-08-02 MED ORDER — ONDANSETRON HCL 4 MG/2ML IJ SOLN: 4.0000 mg | Freq: Three times a day (TID) | INTRAMUSCULAR | Status: DC | PRN

## 2019-08-02 MED ORDER — OXYTOCIN 40 UNITS IN NORMAL SALINE INFUSION - SIMPLE MED: 2.5000 [IU]/h | INTRAVENOUS | Status: DC

## 2019-08-02 MED ORDER — SODIUM CHLORIDE 0.9 % IV SOLN: INTRAVENOUS | Status: DC | PRN

## 2019-08-02 MED ORDER — KETOROLAC TROMETHAMINE 30 MG/ML IJ SOLN
30.0000 mg | Freq: Four times a day (QID) | INTRAMUSCULAR | Status: DC | PRN
  Administered 2019-08-02: 30 mg via INTRAVENOUS

## 2019-08-02 MED ORDER — BUPIVACAINE IN DEXTROSE 0.75-8.25 % IT SOLN
INTRATHECAL | Status: DC | PRN
  Administered 2019-08-02: 1.4 mL via INTRATHECAL

## 2019-08-02 MED ORDER — NALBUPHINE HCL 10 MG/ML IJ SOLN: 5.0000 mg | Freq: Once | INTRAMUSCULAR | Status: DC | PRN

## 2019-08-02 MED ORDER — ACETAMINOPHEN 325 MG PO TABS: 650.0000 mg | ORAL_TABLET | ORAL | Status: DC | PRN

## 2019-08-02 MED ORDER — PROMETHAZINE HCL 25 MG/ML IJ SOLN: 6.2500 mg | INTRAMUSCULAR | Status: DC | PRN

## 2019-08-02 MED ORDER — MEPERIDINE HCL 25 MG/ML IJ SOLN: 6.2500 mg | INTRAMUSCULAR | Status: DC | PRN

## 2019-08-02 MED ORDER — FERROUS SULFATE 325 (65 FE) MG PO TABS
325.0000 mg | ORAL_TABLET | ORAL | Status: DC
  Filled 2019-08-02: qty 1

## 2019-08-02 MED ORDER — TRAMADOL HCL 50 MG PO TABS: 50.0000 mg | ORAL_TABLET | Freq: Four times a day (QID) | ORAL | Status: DC | PRN

## 2019-08-02 MED ORDER — OXYTOCIN BOLUS FROM INFUSION: 500.0000 mL | Freq: Once | INTRAVENOUS | Status: DC

## 2019-08-02 MED ORDER — LAMOTRIGINE 150 MG PO TABS
300.0000 mg | ORAL_TABLET | Freq: Two times a day (BID) | ORAL | Status: DC
  Filled 2019-08-02: qty 2

## 2019-08-02 MED ORDER — MENTHOL 3 MG MT LOZG: 1.0000 | LOZENGE | OROMUCOSAL | Status: DC | PRN

## 2019-08-02 MED ORDER — NALOXONE HCL 4 MG/10ML IJ SOLN
1.0000 ug/kg/h | INTRAVENOUS | Status: DC | PRN
  Filled 2019-08-02: qty 5

## 2019-08-02 MED ORDER — ACETAMINOPHEN 500 MG PO TABS
1000.0000 mg | ORAL_TABLET | Freq: Four times a day (QID) | ORAL | Status: AC
  Administered 2019-08-02 – 2019-08-03 (×3): 1000 mg via ORAL
  Filled 2019-08-02 (×4): qty 2

## 2019-08-02 MED ORDER — PHENYLEPHRINE HCL (PRESSORS) 10 MG/ML IV SOLN
INTRAVENOUS | Status: DC | PRN
  Administered 2019-08-02 (×3): 80 ug via INTRAVENOUS

## 2019-08-02 MED ORDER — ENOXAPARIN SODIUM 60 MG/0.6ML ~~LOC~~ SOLN
60.0000 mg | SUBCUTANEOUS | Status: DC
  Administered 2019-08-03 – 2019-08-05 (×3): 60 mg via SUBCUTANEOUS
  Filled 2019-08-02 (×3): qty 0.6

## 2019-08-02 MED ORDER — ONDANSETRON HCL 4 MG/2ML IJ SOLN
INTRAMUSCULAR | Status: DC | PRN
  Administered 2019-08-02: 4 mg via INTRAVENOUS

## 2019-08-02 MED ORDER — MEASLES, MUMPS & RUBELLA VAC IJ SOLR: 0.5000 mL | Freq: Once | INTRAMUSCULAR | Status: DC

## 2019-08-02 MED ORDER — TERBUTALINE SULFATE 1 MG/ML IJ SOLN: 0.2500 mg | Freq: Once | INTRAMUSCULAR | Status: DC | PRN

## 2019-08-02 MED ORDER — TETANUS-DIPHTH-ACELL PERTUSSIS 5-2.5-18.5 LF-MCG/0.5 IM SUSP: 0.5000 mL | Freq: Once | INTRAMUSCULAR | Status: DC

## 2019-08-02 MED ORDER — SODIUM CHLORIDE 0.9% FLUSH: 3.0000 mL | INTRAVENOUS | Status: DC | PRN

## 2019-08-02 MED ORDER — DIBUCAINE (PERIANAL) 1 % EX OINT: 1.0000 "application " | TOPICAL_OINTMENT | CUTANEOUS | Status: DC | PRN

## 2019-08-02 MED ORDER — GENTAMICIN SULFATE 40 MG/ML IJ SOLN
5.0000 mg/kg | INTRAVENOUS | Status: AC
  Administered 2019-08-02: 584.5 mg via INTRAVENOUS
  Filled 2019-08-02: qty 14.5

## 2019-08-02 MED ORDER — OXYCODONE-ACETAMINOPHEN 5-325 MG PO TABS: 1.0000 | ORAL_TABLET | ORAL | Status: DC | PRN

## 2019-08-02 MED ORDER — SIMETHICONE 80 MG PO CHEW: 80.0000 mg | CHEWABLE_TABLET | ORAL | Status: DC

## 2019-08-02 MED ORDER — ONDANSETRON HCL 4 MG/2ML IJ SOLN
4.0000 mg | Freq: Four times a day (QID) | INTRAMUSCULAR | Status: DC | PRN
  Administered 2019-08-02: 4 mg via INTRAVENOUS
  Filled 2019-08-02: qty 2

## 2019-08-02 MED ORDER — ONDANSETRON HCL 4 MG/2ML IJ SOLN
INTRAMUSCULAR | Status: AC
  Filled 2019-08-02: qty 2

## 2019-08-02 SURGICAL SUPPLY — 37 items
BENZOIN TINCTURE PRP APPL 2/3 (GAUZE/BANDAGES/DRESSINGS) ×6 IMPLANT
CHLORAPREP W/TINT 26ML (MISCELLANEOUS) ×3 IMPLANT
CLAMP CORD UMBIL (MISCELLANEOUS) IMPLANT
CLOTH BEACON ORANGE TIMEOUT ST (SAFETY) ×3 IMPLANT
DRESSING PREVENA PLUS CUSTOM (GAUZE/BANDAGES/DRESSINGS) ×1 IMPLANT
DRSG OPSITE POSTOP 4X10 (GAUZE/BANDAGES/DRESSINGS) ×3 IMPLANT
DRSG PREVENA PLUS CUSTOM (GAUZE/BANDAGES/DRESSINGS) ×3
ELECT REM PT RETURN 9FT ADLT (ELECTROSURGICAL) ×3
ELECTRODE REM PT RTRN 9FT ADLT (ELECTROSURGICAL) ×1 IMPLANT
EXTRACTOR VACUUM M CUP 4 TUBE (SUCTIONS) IMPLANT
EXTRACTOR VACUUM M CUP 4' TUBE (SUCTIONS)
GLOVE BIOGEL PI IND STRL 7.0 (GLOVE) ×3 IMPLANT
GLOVE BIOGEL PI INDICATOR 7.0 (GLOVE) ×6
GLOVE ECLIPSE 7.0 STRL STRAW (GLOVE) ×3 IMPLANT
GOWN STRL REUS W/TWL LRG LVL3 (GOWN DISPOSABLE) ×6 IMPLANT
HOVERMATT SINGLE USE (MISCELLANEOUS) ×3 IMPLANT
KIT ABG SYR 3ML LUER SLIP (SYRINGE) IMPLANT
KIT PREVENA INCISION MGT20CM45 (CANNISTER) ×3 IMPLANT
NEEDLE HYPO 22GX1.5 SAFETY (NEEDLE) ×3 IMPLANT
NEEDLE HYPO 25X5/8 SAFETYGLIDE (NEEDLE) ×3 IMPLANT
NS IRRIG 1000ML POUR BTL (IV SOLUTION) ×3 IMPLANT
PACK C SECTION WH (CUSTOM PROCEDURE TRAY) ×3 IMPLANT
PAD ABD 7.5X8 STRL (GAUZE/BANDAGES/DRESSINGS) ×3 IMPLANT
PAD OB MATERNITY 4.3X12.25 (PERSONAL CARE ITEMS) ×3 IMPLANT
PENCIL SMOKE EVAC W/HOLSTER (ELECTROSURGICAL) ×3 IMPLANT
RETRACTOR TRAXI PANNICULUS (MISCELLANEOUS) ×1 IMPLANT
RTRCTR C-SECT PINK 25CM LRG (MISCELLANEOUS) IMPLANT
SUT PDS AB 0 CTX 36 PDP370T (SUTURE) IMPLANT
SUT PLAIN 2 0 XLH (SUTURE) IMPLANT
SUT VIC AB 0 CTX 36 (SUTURE) ×4
SUT VIC AB 0 CTX36XBRD ANBCTRL (SUTURE) ×2 IMPLANT
SUT VIC AB 4-0 KS 27 (SUTURE) ×3 IMPLANT
SYR CONTROL 10ML LL (SYRINGE) ×3 IMPLANT
TOWEL OR 17X24 6PK STRL BLUE (TOWEL DISPOSABLE) ×3 IMPLANT
TRAXI PANNICULUS RETRACTOR (MISCELLANEOUS) ×2
TRAY FOLEY W/BAG SLVR 14FR LF (SET/KITS/TRAYS/PACK) ×3 IMPLANT
WATER STERILE IRR 1000ML POUR (IV SOLUTION) ×3 IMPLANT

## 2019-08-02 NOTE — Anesthesia Procedure Notes (Signed)
Spinal  Patient location during procedure: OR Start time: 08/02/2019 5:42 AM End time: 08/02/2019 5:48 AM Staffing Performed: anesthesiologist  Anesthesiologist: Catalina Gravel, MD Preanesthetic Checklist Completed: patient identified, IV checked, risks and benefits discussed, surgical consent, monitors and equipment checked, pre-op evaluation and timeout performed Spinal Block Patient position: sitting Prep: DuraPrep and site prepped and draped Patient monitoring: continuous pulse ox and blood pressure Approach: midline Location: L3-4 Injection technique: single-shot Needle Needle type: Pencan  Needle gauge: 24 G Assessment Sensory level: T6 Additional Notes Functioning IV was confirmed and monitors were applied. Sterile prep and drape, including hand hygiene, mask and sterile gloves were used. The patient was positioned and the spine was prepped. The skin was anesthetized with lidocaine.  Free flow of clear CSF was obtained prior to injecting local anesthetic into the CSF.  The spinal needle aspirated freely following injection.  The needle was carefully withdrawn.  The patient tolerated the procedure well. Consent was obtained prior to procedure with all questions answered and concerns addressed. Risks including but not limited to bleeding, infection, nerve damage, paralysis, failed block, inadequate analgesia, allergic reaction, high spinal, itching and headache were discussed and the patient wished to proceed.   Hoy Morn, MD

## 2019-08-02 NOTE — Discharge Summary (Signed)
Postpartum Discharge Summary  Date of Service updated 08/05/19     Patient Name: Anne Benjamin DOB: [birth year] MRN: [id number]  Date of admission: 08/02/2019 Delivering Provider: Chauncey Mann   Date of discharge: 08/05/2019  Admitting diagnosis: AMA (advanced maternal age) multigravida 68+ [O09.529] Intrauterine pregnancy: [redacted]w[redacted]d    Secondary diagnosis:  Principal Problem:   S/P repeat cesarean section Active Problems:   Localization-related idiopathic epilepsy and epileptic syndromes with seizures of localized onset, not intractable, without status epilepticus ([place]   Fibroids   Maternal morbid obesity, antepartum ([place]   Multigravida of advanced maternal age   Seizure disorder during pregnancy, antepartum ([place]   Alpha thalassemia silent carrier   History of pre-eclampsia in prior pregnancy, currently pregnant in third trimester   AMA (advanced maternal age) multigravida 35+  Additional problems: None     Discharge diagnosis: Term Pregnancy Delivered and Gestational Hypertension                                                                                                Post partum procedures:none  Augmentation: Pitocin and Foley Balloon  Complications: None  Hospital course:  Induction of Labor With Cesarean Section  39y.o. yo G3P1011 at 459w0das admitted to the hospital 08/02/2019 for induction of labor. Patient had a labor course significant for IOL for AMA. Foley bulb and Pitocin started. Patient was several hours into induction and then declined further IOL. The patient went for cesarean section due to Elective Repeat, and delivered a Viable infant,08/02/2019  Membrane Rupture Time/Date: 6:15 AM ,08/02/2019   Details of operation can be found in separate operative Note. Diagnosed with gHTN, BP's monitored and were WNL. Patient had an uncomplicated postpartum course. She is ambulating, tolerating a regular diet, passing flatus, and urinating well.  Patient is discharged  home in stable condition on 08/05/19.                                   Delivery time: 6:16 AM    Magnesium Sulfate received: No BMZ received: No Rhophylac:No MMR:No Transfusion:No  Physical exam  Vitals:   08/04/19 1100 08/04/19 1700 08/04/19 2255 08/05/19 0500  BP:  (!) 106/53 120/74 118/65  Pulse:  87 90 89  Resp: 18   20  Temp:  97.7 F (36.5 C) 98.1 F (36.7 C) 97.8 F (36.6 C)  TempSrc:  Oral Oral Oral  SpO2: 100% 99% 100% 100%  Weight:      Height:       General: alert, cooperative and no distress Lochia: appropriate Uterine Fundus: firm Incision: Healing well with no significant drainage DVT Evaluation: No evidence of DVT seen on physical exam. Labs: Lab Results  Component Value Date   WBC 10.9 (H) 08/03/2019   HGB 8.3 (L) 08/03/2019   HCT 26.6 (L) 08/03/2019   MCV 90.2 08/03/2019   PLT 207 08/03/2019   CMP Latest Ref Rng & Units 08/02/2019  Glucose 70 - 99 mg/dL 90  BUN 6 - 20 mg/dL 8  Creatinine 0.44 - 1.00 mg/dL 0.75  Sodium 135 - 145 mmol/L 135  Potassium 3.5 - 5.1 mmol/L 4.7  Chloride 98 - 111 mmol/L 101  CO2 22 - 32 mmol/L 22  Calcium 8.9 - 10.3 mg/dL 8.5(L)  Total Protein 6.5 - 8.1 g/dL 4.8(L)  Total Bilirubin 0.3 - 1.2 mg/dL 0.6  Alkaline Phos 38 - 126 U/L 119  AST 15 - 41 U/L 22  ALT 0 - 44 U/L 19   Edinburgh Score: Edinburgh Postnatal Depression Scale Screening Tool 08/03/2019  I have been able to laugh and see the funny side of things. (No Data)    Discharge instruction: per After Visit Summary and "Baby and Me Booklet".  After visit meds:  Allergies as of 08/05/2019      Reactions   Penicillins Anaphylaxis   Has patient had a PCN reaction causing immediate rash, facial/tongue/throat swelling, SOB or lightheadedness with hypotension: yes Has patient had a PCN reaction causing severe rash involving mucus membranes or skin necrosis: no Has patient had a PCN reaction that required hospitalization : unknown Has patient had a PCN reaction  occurring within the last 10 years: no If all of the above answers are "NO", then may proceed with Cephalosporin use.   Orange Oil Swelling      Medication List    STOP taking these medications   aspirin EC 81 MG tablet   Blood Pressure Kit   ferrous sulfate 325 (65 FE) MG EC tablet   folic acid 1 MG tablet Commonly known as: FOLVITE   lamoTRIgine 100 MG tablet Commonly known as: LAMICTAL   prenatal multivitamin Tabs tablet     TAKE these medications   ibuprofen 800 MG tablet Commonly known as: ADVIL Take 1 tablet (800 mg total) by mouth every 6 (six) hours.       Diet: routine diet  Activity: Advance as tolerated. Pelvic rest for 6 weeks.   Outpatient follow up:4 weeks Follow up Appt: Future Appointments  Date Time Provider Department Center  08/09/2019 11:00 AM [facility] NURSE [facility]  09/06/2019  1:45 PM Pickens, Charlie, MD [facility]  10/21/2019 11:30 AM Aquino, Karen M, MD [facility] None   Follow up Visit: Follow-up Information    [facility]. Schedule an appointment as soon as possible for a visit in 1 week(s).   Specialty: Obstetrics and Gynecology Why: for BP check and incision check Contact information: [address] [place] [address] [phone]            Please schedule this patient for Postpartum visit in: 4 weeks with the following provider: Any provider In-Person For C/S patients schedule nurse incision check in weeks 2 weeks: yes High risk pregnancy complicated by: HTN Delivery mode:  CS Anticipated Birth Control:  POPs PP Procedures needed: BP and incision check  Schedule Integrated BH visit: no     Newborn Data: Live born female  Birth Weight: 7+2  APGAR: 8, 8  Newborn Delivery   Birth date/time: 08/02/2019 06:16:00 Delivery type: C-Section, Low Transverse Trial of labor: Yes C-section categorization: Repeat      Baby Feeding:  Breast Disposition:home with mother   08/05/2019 Marie Williams, CNM   

## 2019-08-02 NOTE — Anesthesia Postprocedure Evaluation (Signed)
Anesthesia Post Note  Patient: Anne Benjamin  Procedure(s) Performed: CESAREAN SECTION (N/A )     Patient location during evaluation: PACU Anesthesia Type: Spinal Level of consciousness: oriented and awake and alert Pain management: pain level controlled Vital Signs Assessment: post-procedure vital signs reviewed and stable Respiratory status: spontaneous breathing, respiratory function stable, patient connected to nasal cannula oxygen and nonlabored ventilation Cardiovascular status: blood pressure returned to baseline and stable Postop Assessment: no headache, no backache, no apparent nausea or vomiting, spinal receding and patient able to bend at knees Anesthetic complications: no    Last Vitals:  Vitals:   08/02/19 0930 08/02/19 1319  BP: 115/63 124/62  Pulse: (!) 105 (!) 101  Resp: 18 18  Temp: 36.7 C 36.7 C  SpO2: 98% 98%    Last Pain:  Vitals:   08/02/19 1700  TempSrc:   PainSc: 0-No pain                 Catalina Gravel

## 2019-08-02 NOTE — Progress Notes (Addendum)
RN requested I speak with patient as patient was requesting repeat C-section. Went bedside to discuss further with patient. Reports she just "does not feel right." She is in a lot of pain and is shaking and is worried about having a seizure. She would like to proceed with a c-section at this time. Attempted to discuss risks/benefits TOLAC vs repeat c-section but patient reported she was "100% sure she wanted a repeat c-section."  The risks of cesarean section discussed with the patient included but were not limited to: bleeding which may require transfusion or reoperation; infection which may require antibiotics; injury to bowel, bladder, ureters or other surrounding organs; injury to the fetus; need for additional procedures including hysterectomy in the event of a life-threatening hemorrhage; placental abnormalities wth subsequent pregnancies, incisional problems, thromboembolic phenomenon and other postoperative/anesthesia complications. The patient concurred with the proposed plan, giving informed written consent for the procedure. Patient has been NPO since 1930 she will remain NPO for procedure. Anesthesia and OR aware. Preoperative prophylactic antibiotics and SCDs ordered on call to the OR.  To OR when ready.  Barrington Ellison, MD OB Family Medicine Fellow, [facility] for [facility], [place] of Attending Supervision of Obstetric Fellow: Evaluation and management procedures were performed by the Obstetric Fellow under my supervision and collaboration.  I have reviewed the Obstetric Fellow's note and chart, and I agree with the management and plan. I have also made any necessary editorial changes.  I also met with patient and reviewed plan as outlined above. To OR soon.   Verita Schneiders, MD, [place] Attending [place], [facility] for [employer], [place]

## 2019-08-02 NOTE — Lactation Note (Signed)
This note was copied from a baby's chart. Lactation Consultation Note  Patient Name: Anne Benjamin [id number] Date: 08/02/2019 Reason for consult: Follow-up assessment;Term;1st time breastfeeding  P2 mother whose infant is now 7 hours old.  This is a term baby at 40+0 weeks.  Mother did not breast feed her first child (now 41 years old).  She tried for a few days but stated she did not produce "any milk."  Mother called for latch assistance.  Baby was starting to awaken slightly.  Offered to assist with latching and mother accepted.  Mother's breasts are soft and non tender and nipples are inverted.  Taught hand expression and had mother do a return demonstration.  No colostrum noted at this time.  Container provided and milk storage times discussed.  Finger feeding demonstrated.  Changed baby's diaper and attempted to latch to the right breast in the football hold.  Baby was not at all interested in opening his mouth to latch.  Gentle stimulation provided but he remained sleepy and uninterested.  Suggested mother hold him STS and placed him on her chest.  He remained sleeping.  Offered breast shells and a manual pump with instructions for use.  #24 flange size is appropriate at this time.  Also offered to initiate the DEBP and mother was interested.  Pump parts, assembly, disassembly and cleaning reviewed.  Father will assist as needed.  Discussed breast feeding concepts while observing mother pump.  Pain medication provided by her RN.    Mother will feed 8-12 times/24 hours or sooner if baby shows feeding cues.  Reviewed cues.  She will hand express before/after feeding and pumping to help stimulate breasts.  Mother will finger feed any EBM she obtains to baby.    Father will follow up with his insurance company on how to obtain a DEBP as discussed earlier today.  Mother will call her RN/LC for latch assistance as needed.  RN updated.   Maternal Data Formula Feeding for Exclusion: No Has  patient been taught Hand Expression?: Yes Does the patient have breastfeeding experience prior to this delivery?: No  Feeding    LATCH Score Latch: Too sleepy or reluctant, no latch achieved, no sucking elicited.  Audible Swallowing: None  Type of Nipple: Inverted  Comfort (Breast/Nipple): Soft / non-tender  Hold (Positioning): Assistance needed to correctly position infant at breast and maintain latch.  LATCH Score: 3  Interventions Interventions: Breast feeding basics reviewed;Assisted with latch;Skin to skin;Breast massage;Hand express;Pre-pump if needed;Breast compression;Adjust position;DEBP;Hand pump;Shells;Position options;Support pillows  Lactation Tools Discussed/Used Pump Review: Setup, frequency, and cleaning;Milk Storage Initiated by:: Julie-Anne Torain Date initiated:: 08/02/19   Consult Status Consult Status: Follow-up Date: 08/03/19 Follow-up type: In-patient    Nalayah Hitt R Sanjay Broadfoot 08/02/2019, 1:09 PM

## 2019-08-02 NOTE — Transfer of Care (Signed)
Immediate Anesthesia Transfer of Care Note  Patient: Anne Benjamin  Procedure(s) Performed: CESAREAN SECTION (N/A )  Patient Location: PACU  Anesthesia Type:Spinal  Level of Consciousness: awake  Airway & Oxygen Therapy: Patient Spontanous Breathing  Post-op Assessment: Report given to RN  Post vital signs: Reviewed and stable  Last Vitals:  Vitals Value Taken Time  BP 107/51 08/02/19 0712  Temp    Pulse    Resp 18 08/02/19 0714  SpO2    Vitals shown include unvalidated device data.  Last Pain:  Vitals:   08/02/19 0038  TempSrc:   PainSc: 0-No pain         Complications: No apparent anesthesia complications

## 2019-08-02 NOTE — Progress Notes (Signed)
Large adult cuff applied for BP checks.

## 2019-08-02 NOTE — Anesthesia Preprocedure Evaluation (Signed)
Anesthesia Evaluation  Patient identified by MRN, date of birth, ID band Patient awake    Reviewed: Allergy & Precautions, NPO status , Patient's Chart, lab work & pertinent test results  Airway Mallampati: IV  TM Distance: >3 FB Neck ROM: Full    Dental  (+) Teeth Intact, Dental Advisory Given   Pulmonary neg pulmonary ROS,    Pulmonary exam normal breath sounds clear to auscultation       Cardiovascular negative cardio ROS Normal cardiovascular exam Rhythm:Regular Rate:Normal     Neuro/Psych Seizures - (Last seizure 3 weeks ago),     GI/Hepatic negative GI ROS, Neg liver ROS,   Endo/Other  Morbid obesity (BMI 53)  Renal/GU negative Renal ROS     Musculoskeletal negative musculoskeletal ROS (+)   Abdominal   Peds  Hematology  (+) Blood dyscrasia, anemia , Plt 259k   Anesthesia Other Findings Day of surgery medications reviewed with the patient.  Reproductive/Obstetrics (+) Pregnancy H/o C-section x1                             Anesthesia Physical Anesthesia Plan  ASA: IV  Anesthesia Plan: Spinal   Post-op Pain Management:    Induction:   PONV Risk Score and Plan: 2 and Treatment may vary due to age or medical condition, Dexamethasone, Ondansetron and Scopolamine patch - Pre-op  Airway Management Planned: Natural Airway  Additional Equipment:   Intra-op Plan:   Post-operative Plan:   Informed Consent: I have reviewed the patients History and Physical, chart, labs and discussed the procedure including the risks, benefits and alternatives for the proposed anesthesia with the patient or authorized representative who has indicated his/her understanding and acceptance.     Dental advisory given  Plan Discussed with: CRNA, Anesthesiologist and Surgeon  Anesthesia Plan Comments: (Discussed risks and benefits of and differences between spinal and general. Discussed risks of  spinal including headache, backache, failure, bleeding, infection, and nerve damage. Patient consents to spinal. Questions answered. Coagulation studies and platelet count acceptable.)        Anesthesia Quick Evaluation

## 2019-08-02 NOTE — Op Note (Signed)
Anne Benjamin PROCEDURE DATE: 08/02/2019  PREOPERATIVE DIAGNOSES: Intrauterine pregnancy at [redacted]w[redacted]d weeks gestation; previous cesarean section; declined trial of labor/induction of labor for advanced maternal age; morbid obesity  POSTOPERATIVE DIAGNOSES: The same  PROCEDURE: Repeat Low Transverse Cesarean Section  SURGEON:  Dr. Verita Schneiders  ASSISTANT: Dr.Chelsea Fair  ANESTHESIOLOGY TEAM: Anesthesiologist: Catalina Gravel, MD CRNA: Asher Muir, CRNA; Sterling, Marina, CRNA  INDICATIONS: Anne Benjamin is a 39 y.o. G3P1011 at [redacted]w[redacted]d here for cesarean section secondary to the indications listed under preoperative diagnoses; please see preoperative note for further details.  The risks of cesarean section were discussed with the patient including but were not limited to: bleeding which may require transfusion or reoperation; infection which may require antibiotics; injury to bowel, bladder, ureters or other surrounding organs; injury to the fetus; need for additional procedures including hysterectomy in the event of a life-threatening hemorrhage; placental abnormalities wth subsequent pregnancies, incisional problems, thromboembolic phenomenon and other postoperative/anesthesia complications.   The patient concurred with the proposed plan, giving informed written consent for the procedure.    FINDINGS:  Viable female infant in cephalic presentation.  Apgars 8/8/9.  Meconium in amniotic fluid.  Intact placenta, three vessel cord.  Normal uterus, fallopian tubes and ovaries bilaterally. Filmy anterior serosal adhesion to omentum, lysed. No other intraperitoneal adhesions.  ANESTHESIA: Spinal INTRAVENOUS FLUIDS: 1700 ml   ESTIMATED BLOOD LOSS: 900 ml URINE OUTPUT:  200 ml SPECIMENS: Placenta sent to L&D COMPLICATIONS: None immediate  PROCEDURE IN DETAIL:  The patient preoperatively received intravenous antibiotics and had sequential compression devices applied to her lower extremities.  She  was then taken to the operating room where spinal anesthesia was administered and was found to be adequate. She was then placed in a dorsal supine position with a leftward tilt, and prepped and draped in a sterile manner.  A foley catheter was placed into her bladder and attached to constant gravity.  After an adequate timeout was performed, a Pfannenstiel skin incision was made with scalpel on her preexisting scar and carried through to the underlying layer of fascia. The fascia was incised in the midline, and this incision was extended bilaterally using the Mayo scissors.  Kocher clamps were applied to the superior aspect of the fascial incision and the underlying rectus muscles were dissected off bluntly and sharply.  A similar process was carried out on the inferior aspect of the fascial incision. The rectus muscles were separated in the midline and the peritoneum was entered bluntly. The Alexis self-retaining retractor was introduced into the abdominal cavity.  Attention was turned to the lower uterine segment where the filmy serosal adhesion to omentum was lysed with electrocautery.  Then, a low transverse hysterotomy was made with a scalpel and extended bilaterally bluntly.  The infant was successfully delivered, the cord was clamped and cut after one minute, and the infant was handed over to the awaiting neonatology team. Uterine massage was then administered, and the placenta delivered intact with a three-vessel cord. The uterus was then cleared of clots and debris.  The hysterotomy was closed with 0 Vicryl in a running locked fashion, and an imbricating layer was also placed with 0 Vicryl.  The pelvis was cleared of all clot and debris. Hemostasis was confirmed on all surfaces.  The retractor was removed.  The peritoneum was closed with a 0 Vicryl running stitch. The fascia was then closed using 0 looped PDS in a running fashion.  The subcutaneous layer was irrigated,  reapproximated with 2-0  plain gut  interrupted stitches, and the skin was closed with a 4-0 Vicryl subcuticular stitch.   After the skin was closed, a Prevena disposable negative pressure wound therapy device was placed over the incision.  The suction was activated at a pressure of 80 mmHg.  The adhesive was affixed well and there were no leaks noted.  The patient tolerated the procedure well. Sponge, instrument and needle counts were correct x 3.  She was taken to the recovery room in stable condition.    Verita Schneiders, MD, [place] for [employer], [place]

## 2019-08-02 NOTE — Lactation Note (Signed)
This note was copied from a baby's chart. Lactation Consultation Note  Patient Name: Anne Benjamin [id number] Date: 08/02/2019 Reason for consult: Initial assessment;Term  P2 mother whose infant is now 34 hours old.  This is a term baby at 40+0 weeks.  This is the third attempt to visit and assist mother, however, she can hardly keep her eyes open to visit with me.  For this reason, I have limited my time spent in her room.    Baby was in father's arms when I arrived.  Mother had no questions/concerns at this time.  She has not latched since coming to her room but baby has breast fed once after delivery.  Mother will feed 8-12 times/24 hours or sooner if baby shows feeding cues.  She was too tired to practice hand expression but stated that her RN showed her how to do it; no drops noted yet.  Colostrum container provided and milk storage times reviewed.  Finger feeding demonstrated.    Mom made aware of O/P services, breastfeeding support groups, community resources, and our phone # for post-discharge questions.  Mother does not have a DEBP for home use.  She does have private insurance.  Suggested father may want to call the insurance company to determine pump eligibility.  Asked mother to call her RN/LC for a return visit when she feels more awake.  Mother verbalized understanding.   Maternal Data    Feeding    LATCH Score                   Interventions    Lactation Tools Discussed/Used     Consult Status Consult Status: Follow-up Date: 08/03/19 Follow-up type: In-patient    Allyson Tineo R Treyvion Durkee 08/02/2019, 11:46 AM

## 2019-08-02 NOTE — H&P (Signed)
OBSTETRIC ADMISSION HISTORY AND PHYSICAL  Anne Benjamin is a 39 y.o. female G38P1011 with IUP at 110w0dby LMP c/w 11 wk [place]ore[place]resenting for IOL secondary to [place] She reports +FMs, No LOF, no VB, no blurry vision, headaches or peripheral edema, and RUQ pain.  She plans on breast feeding. She requests POPs for birth control. [facility]  [facility]  '@[redacted]w[redacted]d' , CWD, normal anatomy, cephalic presentation, anterior placental lie, 3008g, 71% EFW  Prenatal History/Complications: AMA Hx of Pre-E Hx of C-section  Seizure Disorder  Morbid Obesity Uterine Fibroid Myomas   Site                     L(cm)      W(cm)      D(cm)      Location   Anterior LUS             2.1        1.3        2.1        Intramural  Past Medical History: Past Medical History:  Diagnosis Date  . [place]izures ([place] 09/03/2012  . Obesity   . [place]ures [place]     Past Surgical History: Past Surgical History:  Procedure Laterality Date  . CESAREAN SECTION     x 1    Obstetrical History: OB History    Gravida  3   Para  1   Term  1   Preterm      AB  1   Living  1     SAB  1   TAB      Ectopic      Multiple      Live Births  1           Social History: Social History   Socioeconomic History  . Marital status: Married    Spouse name: Not on file  . Number of children: 1  . Years of education: S-College  . Highest education level: Not on file  Occupational History  . Not on file  Tobacco Use  . Smoking status: Never Smoker  . Smokeless tobacco: Never Used  Substance and Sexual Activity  . Alcohol use: Not Currently    Comment: occasional  . Drug use: No  . Sexual activity: Yes    Birth control/protection: None  Other Topics Concern  . Not on file  Social History Narrative   Lives with husband in a two story home      Right handed      Highest level of edu- some college   Social Determinants of Health   Financial Resource Strain:   . Difficulty  of Paying Living Expenses:   Food Insecurity:   . Worried About RCharity fundraiserin the Last Year:   . RArboriculturistin the Last Year:   Transportation Needs:   . LFilm/video editor(Medical):   .Marland KitchenLack of Transportation (Non-Medical):   Physical Activity:   . Days of Exercise per Week:   . Minutes of Exercise per Session:   Stress:   . Feeling of Stress :   Social Connections:   . Frequency of Communication with Friends and Family:   . Frequency of Social Gatherings with Friends and Family:   . Attends Religious Services:   . Active Member of Clubs or Organizations:   . Attends CArchivistMeetings:   .Marland KitchenMarital Status:  Family History: Family History  Problem Relation Age of Onset  . Hypertension Mother   . Diabetes Mother     Allergies: Allergies  Allergen Reactions  . Penicillins Anaphylaxis    Has patient had a PCN reaction causing immediate rash, facial/tongue/throat swelling, SOB or lightheadedness with hypotension: yes Has patient had a PCN reaction causing severe rash involving mucus membranes or skin necrosis: no Has patient had a PCN reaction that required hospitalization : unknown Has patient had a PCN reaction occurring within the last 10 years: no If all of the above answers are "NO", then may proceed with Cephalosporin use.   Haig Prophet Oil Swelling    Medications Prior to Admission  Medication Sig Dispense Refill Last Dose  . aspirin EC 81 MG tablet Take 1 tablet (81 mg total) by mouth daily. Take after 12 weeks for prevention of preeclampsia later in pregnancy 300 tablet 2 08/01/2019 at Unknown time  . ferrous sulfate 325 (65 FE) MG EC tablet Take 325 mg by mouth 3 (three) times daily with meals.   08/01/2019 at Unknown time  . folic acid (FOLVITE) 1 MG tablet Take 1 tablet by mouth once daily 90 tablet 0 08/01/2019 at Unknown time  . lamoTRIgine (LAMICTAL) 100 MG tablet Take 3 tablets twice a day 180 tablet 11 08/01/2019 at Unknown time  .  Multiple Vitamin (MULTIVITAMIN) tablet Take 1 tablet by mouth daily.   08/01/2019 at Unknown time  . Blood Pressure KIT 1 Device by Does not apply route once a week. To be monitored weekly from home 1 kit 0      Review of Systems   All systems reviewed and negative except as stated in HPI  Blood pressure 129/72, pulse (!) 105, temperature 98.4 F (36.9 C), temperature source Oral, resp. rate 17, height '4\' 10"'  (1.473 m), weight 116.9 kg, last menstrual period 10/26/2018, unknown if currently breastfeeding. General appearance: alert, cooperative, appears stated age and morbidly obese Lungs: normal effort Heart: regular rate  Abdomen: soft, non-tender; bowel sounds normal Pelvic: gravid uterus GU: No vaginal lesions  Extremities: Homans sign is negative, no sign of DVT Presentation: cephalic Fetal monitoringBaseline: 145 bpm, Variability: Good {> 6 bpm), Accelerations: Reactive and Decelerations: Absent Uterine activity: Frequency: Every 2-5 minutes Dilation: Fingertip Effacement (%): Thick Station: -3 Exam by:: Dr. Marice Potter   Prenatal labs: ABO, Rh: --/--/PENDING (03/29 2409) Antibody: PENDING (03/29 0058) Rubella: 6.68 (09/08 1054) RPR: Non Reactive (02/04 0826)  HBsAg: Negative (09/08 1054)  HIV: Non Reactive (02/04 0826)  GBS: Negative/-- (03/04 1018)  2 hr Glucola WNL Genetic screening  Low risk Anatomy US WNL  Prenatal Transfer Tool  Maternal Diabetes: No Genetic Screening: Normal Maternal Ultrasounds/Referrals: Normal Fetal Ultrasounds or other Referrals:  None Maternal Substance Abuse:  No Significant Maternal Medications: Lamictal  Significant Maternal Lab Results: Group B Strep negative  Results for orders placed or performed during the hospital encounter of 08/02/19 (from the past 24 hour(s))  CBC   Collection Time: 08/02/19 12:58 AM  Result Value Ref Range   WBC 11.6 (H) 4.0 - 10.5 K/uL   RBC 4.01 3.87 - 5.11 MIL/uL   Hemoglobin 11.5 (L) 12.0 - 15.0 g/dL    HCT 35.8 (L) 36.0 - 46.0 %   MCV 89.3 80.0 - 100.0 fL   MCH 28.7 26.0 - 34.0 pg   MCHC 32.1 30.0 - 36.0 g/dL   RDW 14.8 11.5 - 15.5 %   Platelets 259 150 - 400 K/uL   nRBC  0.0 0.0 - 0.2 %  Type and screen   Collection Time: 08/02/19 12:58 AM  Result Value Ref Range   ABO/RH(D) PENDING    Antibody Screen PENDING    Sample Expiration      08/05/2019,2359 Performed at [facility], [place] [address], [place], [place] [address]     Patient Active Problem List   Diagnosis Date Noted  . AMA (advanced maternal age) multigravida 35+ 08/02/2019  . History of penicillin allergy 06/23/2019  . History of pre-eclampsia in prior pregnancy, currently pregnant in third trimester 06/10/2019  . Alpha thalassemia silent carrier 02/09/2019  . History of cesarean delivery affecting pregnancy 01/12/2019  . Supervision of high risk pregnancy, antepartum 01/12/2019  . Maternal morbid obesity, antepartum ([place]) 01/12/2019  . Multigravida of advanced maternal age 32/12/2018  . Seizure disorder during pregnancy, antepartum ([place]) 01/12/2019  . Fibroids 03/16/2018  . Localization-related idiopathic epilepsy and epileptic syndromes with seizures of localized onset, not intractable, without status epilepticus ([place]) 03/03/2014  . Seizure ([place]) 02/24/2014    Assessment/Plan:  Anne Benjamin is a 39 y.o. G3P1011 at 35w0dhere [facility]  #Labor: Vertex by exam and BSUS. VBAC consent signed 06/10/2019. Patient reports she was complete and pushing and that umbilical cord came in front of head and they had to go back for section. Foley bulb placed with speculum and filled with 60 mL water; patient tolerated well. Will start Pitocin. Max of 6 while FB in place and then max of 20 when FB out unless IUPC in place. Anticipate VBAC.  #Pain: Per patient request #FWB: Cat I; EFW: 3900g #ID:  GBS neg #MOF: Breast #MOC: POPs #Circ:  Inpatient #Seizure Disorder: Last seen by Neuro 06/25/2019. Tonic-clonic  epilepsy. Comt Lamictal 300 mg BID which was increased by Neuro 3 weeks ago as patient had a seizure.  #Hx of Pre-E: BP's normal throughout pregnancy. Pre-E labs PRN.   CBarrington Ellison MD [facility] Health RichlandFamily Medicine Fellow, FPacific Shores Hospitalfor [facility] CPort [facility]Group 08/02/2019, 1:53 AM

## 2019-08-03 LAB — CBC
HCT: 26.6 % — ABNORMAL LOW (ref 36.0–46.0)
Hemoglobin: 8.3 g/dL — ABNORMAL LOW (ref 12.0–15.0)
MCH: 28.1 pg (ref 26.0–34.0)
MCHC: 31.2 g/dL (ref 30.0–36.0)
MCV: 90.2 fL (ref 80.0–100.0)
Platelets: 207 10*3/uL (ref 150–400)
RBC: 2.95 MIL/uL — ABNORMAL LOW (ref 3.87–5.11)
RDW: 15 % (ref 11.5–15.5)
WBC: 10.9 10*3/uL — ABNORMAL HIGH (ref 4.0–10.5)
nRBC: 0 % (ref 0.0–0.2)

## 2019-08-03 NOTE — Progress Notes (Addendum)
POSTPARTUM PROGRESS NOTE  Post Partum/Op Day 1  Subjective:  Anne Benjamin is a 39 y.o. [id number] s/p rLTCS at [redacted]w[redacted]d.  She reports she is doing well. No acute events overnight. She denies any problems with ambulating, voiding or po intake. She has not had a BM but has passed flatus. Denies nausea or vomiting.  Pain is well controlled.  Lochia is appropriate and minimal.  Objective: Blood pressure (!) 102/52, pulse 94, temperature 98 F (36.7 C), temperature source Oral, resp. rate 20, height 4\' 10"  (1.473 m), weight 116.9 kg, last menstrual period 10/26/2018, SpO2 99 %, unknown if currently breastfeeding.  Physical Exam:  General: alert, cooperative and no distress Chest: no respiratory distress Heart:regular rate, distal pulses intact Abdomen: soft, nontender Uterine Fundus: firm, appropriately tender DVT Evaluation: No calf swelling or tenderness Extremities: minimal bilateral lower extremity pitting edema Skin: warm, dry  Recent Labs    08/02/19 0922 08/03/19 0506  HGB 10.3* 8.3*  HCT 32.3* 26.6*    Assessment/Plan: Anne Benjamin is a 39 y.o. [id number] s/p rLTCS at [redacted]w[redacted]d   POD#1 - Doing well. Continue routine postpartum care.  Contraception: POP's Feeding: Breast Pre-E: One elevated BP at 141/84 since delivery. No severe pressures or symptoms.  Anemia: Hgb 10.3>8.3. Will give IV iron. Dispo: Plan for discharge in 1-2 days.   LOS: 1 day    Jodene Nam MD, PGY-1 OBGYN Faculty Teaching Service  08/03/2019, 7:07 AM   I saw and evaluated the patient. I agree with the findings and the plan of care as documented in the resident's note. Continue to monitor BP's for anti-hypertensive need.   Barrington Ellison, MD [facility] Family Medicine Fellow, [facility] for [employer], [place]

## 2019-08-04 MED ORDER — SODIUM CHLORIDE 0.9 % IV SOLN
510.0000 mg | Freq: Once | INTRAVENOUS | Status: AC
  Administered 2019-08-04: 11:00:00 510 mg via INTRAVENOUS
  Filled 2019-08-04: qty 17

## 2019-08-04 NOTE — Progress Notes (Addendum)
Subjective: Postpartum Day 2: Cesarean Delivery rLTCS, declined TOLAC/IOL AMAat [redacted]w[redacted]d. She reports she is doing well. Patient reports tolerating PO, + flatus and no problems voiding.    Objective: Vital signs in last 24 hours: Temp:  [98 F (36.7 C)-98.6 F (37 C)] 98 F (36.7 C) (03/31 [id number]) Pulse Rate:  [90-112] 90 (03/31 0633) Resp:  [16-20] 16 (03/31 [id number]) BP: (90-107)/(55-63) 107/59 (03/31 [id number]) SpO2:  [98 %-99 %] 99 % (03/30 2153)  Physical Exam:  General: alert, cooperative and no distress Lochia: appropriate Uterine Fundus: firm Incision: dressing dry and intact. Wound Vac in place. DVT Evaluation: No evidence of DVT seen on physical exam.  Recent Labs    08/02/19 0922 08/03/19 0506  HGB 10.3* 8.3*  HCT 32.3* 26.6*    Assessment/Plan: Anne Benjamin is a 39 y.o. [id number] s/p rLTCS at [redacted]w[redacted]d.  POD#2- Doing well postoperatively. Continue routine postpartum care. Contraception: POP's Feeding: Breast and Formula Pre-E: Most recent BP 107/59 Anemia: Hgb 10.3 > 8.3. On PO Iron 325 mg. IV iron ordered Dispo: Plan for discharge on POD#3.   Anne Benjamin, Medical Student 08/04/2019, 7:29 AM  GME ATTESTATION:  I saw and evaluated the patient. I agree with the findings and the plan of care as documented in the resident's note.  Merilyn Baba, DO OB Fellow, [place] for [place] 08/04/2019 7:47 AM

## 2019-08-04 NOTE — Lactation Note (Signed)
This note was copied from a baby's chart. Lactation Consultation Note Mom has been unable to latch baby. Mom has been formula feeding baby. Mom has inverted nipples. Breast are Large, heavy, "V" shaped. Edema noted to breast. Lt. Breast hard w/edema in certain areas. Lt. Breast hardened. Reverse pressure helpful but not enough to make breast compressible for latching. #24 NS attempted but will not stay on at this time.  Encouraged mom to pump every 3 hrs for lactation induction. Discussed w/mom until her breast softens she will cont. To need to give formula until able to latch.  LC will work w/mom this evening to see if breast are softer. Reported to RN.  Patient Name: Anne Benjamin [id number] Date: 08/04/2019 Reason for consult: Follow-up assessment;1st time breastfeeding;Term   Maternal Data    Feeding Feeding Type: Formula Nipple Type: Slow - flow  LATCH Score       Type of Nipple: Inverted  Comfort (Breast/Nipple): Filling, red/small blisters or bruises, mild/mod discomfort(edema)        Interventions Interventions: Hand express;Shells;Pre-pump if needed;Reverse pressure;DEBP;Breast compression  Lactation Tools Discussed/Used Pump Review: Setup, frequency, and cleaning;Milk Storage Initiated by:: RN   Consult Status Consult Status: Follow-up Date: 08/04/19(in pm) Follow-up type: In-patient    Theodoro Kalata 08/04/2019, 2:52 AM

## 2019-08-05 MED ORDER — IBUPROFEN 800 MG PO TABS: 800.0000 mg | ORAL_TABLET | Freq: Four times a day (QID) | ORAL | 0 refills | Status: AC

## 2019-08-05 NOTE — Discharge Instructions (Signed)

## 2019-08-05 NOTE — Lactation Note (Signed)
This note was copied from a baby's chart. Lactation Consultation Note  Patient Name: [name] [id number] Date: 08/05/2019 Reason for consult: Follow-up assessment   P2, Baby 43 hours old and mother has been primarily formula feeding. Encouraged offering the breast before formula to help establish her milk supply. Discussed pumping at least 8 times per day to help establish her milk supply. Mother will call [facility] if she desires assistance. Suggest calling her insurance company today to inquire about DEBP. Reviewed manual pump use and double manual pump conversion. Feed on demand with cues.  Goal 8-12+ times per day after first 24 hrs.  Place baby STS if not cueing.  Reviewed engorgement care and monitoring voids/stools.    Maternal Data    Feeding    LATCH Score                   Interventions Interventions: DEBP  Lactation Tools Discussed/Used     Consult Status Consult Status: Complete Date: 08/05/19    Vivianne Master [facility] 08/05/2019, 10:20 AM

## 2019-08-05 NOTE — Lactation Note (Signed)
This note was copied from a baby's chart. Lactation Consultation Note Attempted to see mom but she was sleeping.  Patient Name: Anne Benjamin [id number] Date: 08/05/2019     Maternal Data    Feeding Feeding Type: Bottle Fed - Formula Nipple Type: Slow - flow  LATCH Score                   Interventions    Lactation Tools Discussed/Used     Consult Status      Doris Mcgilvery G 08/05/2019, 3:17 AM

## 2019-08-09 ENCOUNTER — Ambulatory Visit: Payer: BC Managed Care – PPO

## 2019-08-09 ENCOUNTER — Other Ambulatory Visit: Payer: Self-pay

## 2019-08-09 DIAGNOSIS — Z0130 Encounter for examination of blood pressure without abnormal findings: Secondary | ICD-10-CM

## 2019-08-09 MED ORDER — NIFEDIPINE ER OSMOTIC RELEASE 30 MG PO TB24: 30.0000 mg | ORAL_TABLET | Freq: Every day | ORAL | 2 refills | Status: DC

## 2019-08-09 NOTE — Telephone Encounter (Signed)
Patient reports to the clinic for blood pressure check and prevana removal. Patient blood pressure is out of range this am at 141/92 recheck 15 minutes later at 151/112 after removal prevana it was 150/100. She does not report any headache,blurred vision or dizziness at this time. She does have some swelling in her feet.  Called Dr.Anyanwu who suggested we start patient on procardia 30 xl and recheck blood pressure in 2 days. Medication called into the pharmacy for patient to pick. We will follow up with patient in 2 days.

## 2019-08-11 ENCOUNTER — Other Ambulatory Visit: Payer: Self-pay

## 2019-08-11 ENCOUNTER — Ambulatory Visit (INDEPENDENT_AMBULATORY_CARE_PROVIDER_SITE_OTHER): Payer: BC Managed Care – PPO | Admitting: *Deleted

## 2019-08-11 VITALS — BP 137/89 | HR 96

## 2019-08-11 DIAGNOSIS — O139 Gestational [pregnancy-induced] hypertension without significant proteinuria, unspecified trimester: Secondary | ICD-10-CM

## 2019-08-11 NOTE — Progress Notes (Signed)
Subjective:  Anne Benjamin is a 39 y.o. female here for BP check and prevana removal. Patient blood pressure is out of range this am at 141/92 recheck 15 min after reading was 151/112 after the removal of wound vac blood pressure was 150/100. She does not report any headache,blurred vision or dizziness at this time. She does have some swelling in her feet. On call provider was contacted and suggested we start her on Procardia 30 xl once per day and recheck bp in two days. Medication called into the pharmacy for patient to start today.   Hypertension ROS:Not currently on any medications. She does not report any changes to her health other than swelling in her feet.    Objective:   Appearance alert, well appearing, and in no distress. General exam BP noted to be  Not  well controlled today in office.    Assessment:   Blood Pressure: 141/92   Plan: Start on Procadia XL 30MG  and recheck in 2 days.

## 2019-08-11 NOTE — Progress Notes (Signed)
Subjective:  Anne Benjamin is a 39 y.o. female here for BP check.   Hypertension ROS: taking medications as instructed, no medication side effects noted, no TIA's, no chest pain on exertion, no dyspnea on exertion and no swelling of ankles.    Objective:  BP 137/89   Pulse 96   Appearance alert, well appearing, and in no distress. General exam BP noted to be well controlled today in office.    Assessment:   Blood Pressure well controlled.   Plan:  Current treatment plan is effective, no change in therapy. Follow up at postpartum visit or as needed.

## 2019-08-16 ENCOUNTER — Ambulatory Visit: Payer: BC Managed Care – PPO

## 2019-08-16 NOTE — Progress Notes (Signed)
Attestation of Attending Supervision of clinical support staff: I agree with the care provided to this patient and was available for any consultation.  I have reviewed the RN's note and chart. I was available for consult and to see the patient if needed.   Vivika Poythress [place] Marciel Offenberger, MD, MPH, ABFM Attending Physician Faculty Practice- [facility]  

## 2019-09-06 ENCOUNTER — Other Ambulatory Visit: Payer: Self-pay

## 2019-09-06 ENCOUNTER — Ambulatory Visit (INDEPENDENT_AMBULATORY_CARE_PROVIDER_SITE_OTHER): Payer: BC Managed Care – PPO | Admitting: Obstetrics and Gynecology

## 2019-09-06 MED ORDER — NIFEDIPINE ER OSMOTIC RELEASE 30 MG PO TB24: 30.0000 mg | ORAL_TABLET | Freq: Every day | ORAL | 0 refills | Status: DC

## 2019-09-06 NOTE — Progress Notes (Signed)
    [place] Partum Visit Note  Anne Benjamin is a 39 y.o. G70P2012 female who presents for a postpartum visit. She is 5 weeks postpartum following a repeat cesarean section.  I have fully reviewed the prenatal and intrapartum course. The delivery was at 40.0 gestational weeks.  Anesthesia: spinal. Postpartum course has been uncomplicated. Baby is doing well. Baby is feeding by bottle - Similac proadvance. Bleeding no bleeding. Bowel function is normal. Bladder function is normal. Patient is not sexually active. Contraception method is undecided.. Postpartum depression screening: negative.   Review of Systems Pertinent items noted in HPI and remainder of comprehensive ROS otherwise negative.    Objective:  Blood pressure (!) 140/94, pulse 91, weight 224 lb (101.6 kg), unknown if currently breastfeeding.  NAD Abdomen: soft, nttp, nd. Well healed low transverse skin incision Assessment:  Pt doing well  Plan:   Essential components of care per ACOG recommendations:  1.  Mood and well being: no issues  2. Infant care and feeding: formula feeding  3. Sexuality, contraception and birth spacing No sex yet and pt undecided on [facility]. Options d/w her  4. Sleep and fatigue  5. Physical Recovery  Well healed  6.  Health Maintenance Pap smear UTD  7. gHTN RTC one month for BP check to see if can come off the procardia and f/u on [facility]  Aletha Halim, MD Center for [employer], [place]

## 2019-09-16 ENCOUNTER — Other Ambulatory Visit: Payer: Self-pay | Admitting: Neurology

## 2019-09-16 MED ORDER — LAMOTRIGINE 100 MG PO TABS: ORAL_TABLET | ORAL | 3 refills | Status: DC

## 2019-10-21 ENCOUNTER — Telehealth (INDEPENDENT_AMBULATORY_CARE_PROVIDER_SITE_OTHER): Payer: BC Managed Care – PPO | Admitting: Neurology

## 2019-10-21 ENCOUNTER — Encounter: Payer: Self-pay | Admitting: Neurology

## 2019-10-21 ENCOUNTER — Other Ambulatory Visit: Payer: Self-pay

## 2019-10-21 VITALS — Ht <= 58 in | Wt 210.0 lb

## 2019-10-21 DIAGNOSIS — G40009 Localization-related (focal) (partial) idiopathic epilepsy and epileptic syndromes with seizures of localized onset, not intractable, without status epilepticus: Secondary | ICD-10-CM

## 2019-10-21 NOTE — Progress Notes (Signed)
Virtual Visit via Video Note The purpose of this virtual visit is to provide medical care while limiting exposure to the novel coronavirus.    Consent was obtained for video visit:  Yes.   Answered questions that patient had about telehealth interaction:  Yes.   I discussed the limitations, risks, security and privacy concerns of performing an evaluation and management service by telemedicine. I also discussed with the patient that there may be a patient responsible charge related to this service. The patient expressed understanding and agreed to proceed.  Pt location: Private vehicle Physician Location: office Name of referring provider:  [name], MD I connected with Anne Benjamin at patients initiation/request on 10/21/2019 at 11:30 AM EDT by video enabled telemedicine application and verified that I am speaking with the correct person using two identifiers. Pt MRN:  [id number] Pt DOB:  26-May-1980 Video Participants:  Anne Benjamin   History of Present Illness:  The patient had a virtual video visit on 10/21/2019. 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 Since her last visit, she has delivered a healthy baby boy [place] last 08/02/19. She did have a seizure on 06/26/19, at that time Lamotrigine dose was increased to 300mg  BID. Lamictal level on this dose was 8.7. She has opted to stay on the same dose after delivery, she feels better on current dose with no further seizures since 06/2019, no side effects. She denies any staring/unresponsive episodes, focal numbness/tingling/weakness, headaches, dizziness, diplopia, no falls. Sleep is here and there but she is getting enough sleep. She will be going to [place] this week to help care for her mother for 3 months. Mood is good.   HPI: This is a pleasant 39 yo RH woman with seizures since 09/01/2012. She was at home when her husband heard her fall on the floor and found her shaking with all extremities extended.  She had told him she felt tired that day after being up for 72 hours studying and finishing a paper for school. She woke up with EMS around her, feeling tired, no focal weakness. Per EMS report, she had a post-ictal phase of approximately 30 minutes. CBC showed a WBC of 10.7, BMP unremarkable. She had seen neurologist Dr. Jannifer Franklin and routine EEG done showed intermittent dysrhythmic theta slowing from the left mid-temporal region, with occasional sharp transients. At no time did there appear to be evidence of spike or spike wave discharges. She was unable to do MRI brain ordered. She was started on Keppra 500mg  BID, however she felt very sleepy and unable to function on this, and had self-reduced dose to 250mg  daily. She had been on this dose and had another seizure that occurred out of sleep in June/July 2015. Her husband heard her shaking, with urinary incontinence and tongue bite. She may have been sleep-deprived that time. She did not seek medical attention. On 02/24/14, she was asleep at 4am when her husband woke up to her having a convulsion that lasted 30 seconds. She went back to sleep. He came back in the room at 10am and she recalls asking him if she had another seizure, he reported that she looked like she had another one and there was a wet spot on her pajamas. Around noon, he heard a noise but thought it was a dog outside. Then at 3:30pm, he was in the room and again heard the same noise and found her having another convulsion. She was brought to [facility] where she was  noted to be febrile with rectal temperature of 101.3. Her WBC was 17.2. She had a head CT which I personally reviewed which was normal. She had a lumbar puncture which was traumatic with 700 RBC pink hazy CSF, 2 WBC, protein 13, glucose 85, gram stain and culture, VDRL, HSV, and Cryptococcal Ab negative. She was started on Dilantin, last level 8.0, discharged home on Dilantin 100mg  in AM, 150mg  at 3pm, 100mg  qhs which also caused drowsiness. She  was started on Lamictal in October 2015.   She had a breakthrough seizure last 05/30/16 after being seizure-free for 2 years on Lamotrigine 100mg  BID with no side effects. She was at work then recalls feeling extremely nauseated. She had a bad night of sleep the night prior due to significant menstrual cramps, which is unusual for her. She denied any infection, no missed medication.   Her husband had noticed infrequent blanking out episodes lasting 10 seconds a year ago, none recently. She denies any gaps in time, no olfactory/gustatory hallucinations, deja vu, rising epigastric sensation, focal numbness/tingling/weakness, myoclonic jerks.   Epilepsy Risk Factors: Her paternal half-sister has seizures. She had 2 concussions in her 75s, one with loss of consciousness. Otherwise she had a normal birth and early development. There is no history of febrile convulsions, CNS infections such as meningitis/encephalitis, significant traumatic brain injury, neurosurgical procedures.  Prior AEDs: Keppra, Dilantin  Lamictal level 06/05/17 on LTG 150mg  BID: 6.5    Current Outpatient Medications on File Prior to Visit  Medication Sig Dispense Refill  . ibuprofen (ADVIL) 800 MG tablet Take 1 tablet (800 mg total) by mouth every 6 (six) hours. 30 tablet 0  . lamoTRIgine (LAMICTAL) 100 MG tablet Take 3 tablets twice a day 540 tablet 3  . NIFEdipine (PROCARDIA-XL/NIFEDICAL-XL) 30 MG 24 hr tablet Take 1 tablet (30 mg total) by mouth daily. 30 tablet 0   No current facility-administered medications on file prior to visit.     Observations/Objective:   Vitals:   10/21/19 1050  Weight: 210 lb (95.3 kg)  Height: 4\' 10"  (1.473 m)   GEN:  The patient appears stated age and is in NAD.  Neurological examination: Patient is awake, alert, oriented x 3. No aphasia or dysarthria. Intact fluency and comprehension. Remote and recent memory intact.  Cranial nerves: Extraocular movements intact with no nystagmus. No  facial asymmetry. Motor: moves all extremities symmetrically, at least anti-gravity x 4.  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 Routine EEG in 2014 had shown left temporal slowing, MRI brain normal. She had a breakthrough seizure last 06/26/19 after being seizure-free for over a year. She is now on Lamotrigine 300mg  BID without side effects, no seizures since 06/2019. She is aware of [place] driving laws to stop driving after a seizure until 6 months seizure-free. Follow-up in 6 months, she knows to call for any changes.    Follow Up Instructions:   -I discussed the assessment and treatment plan with the patient. The patient was provided an opportunity to ask questions and all were answered. The patient agreed with the plan and demonstrated an understanding of the instructions.   The patient was advised to call back or seek an in-person evaluation if the symptoms worsen or if the condition fails to improve as anticipated.    Cameron Sprang, MD

## 2020-04-20 IMAGING — US US OB < 14 WEEKS - US OB TV
1 series · 15 of 28 positions shown · non-contrast
Comparison: None.

CLINICAL DATA: Early pregnancy.  Vaginal bleeding.

EXAM:
OBSTETRIC <14 WK US AND TRANSVAGINAL OB US
TECHNIQUE: Both transabdominal and transvaginal ultrasound examinations were
performed for complete evaluation of the gestation as well as the
maternal uterus, adnexal regions, and pelvic cul-de-sac.
Transvaginal technique was performed to assess early pregnancy.

[Series 1: us ob < 14 weeks - us ob tv · 15 of 64 slices shown]
[im 1/64]
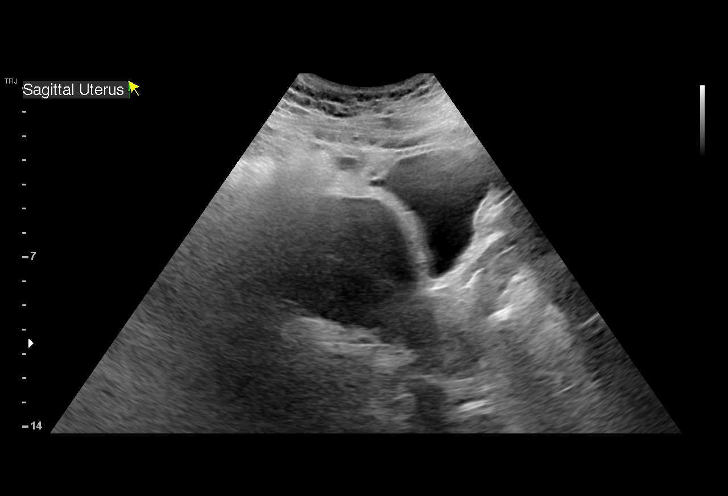
[im 5/64]
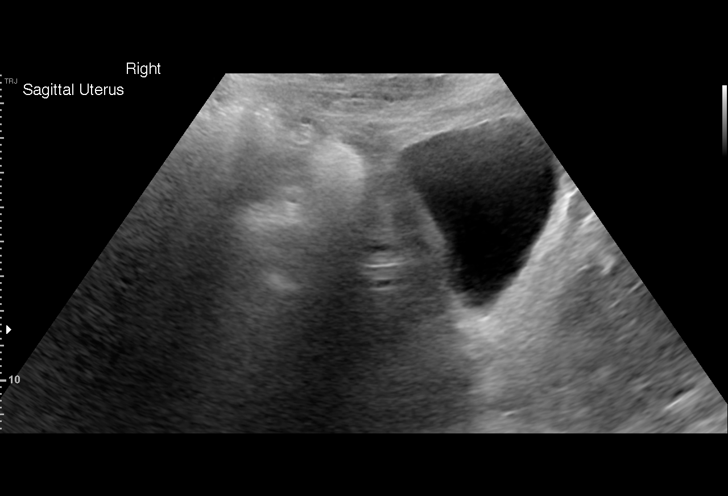
[im 10/64]
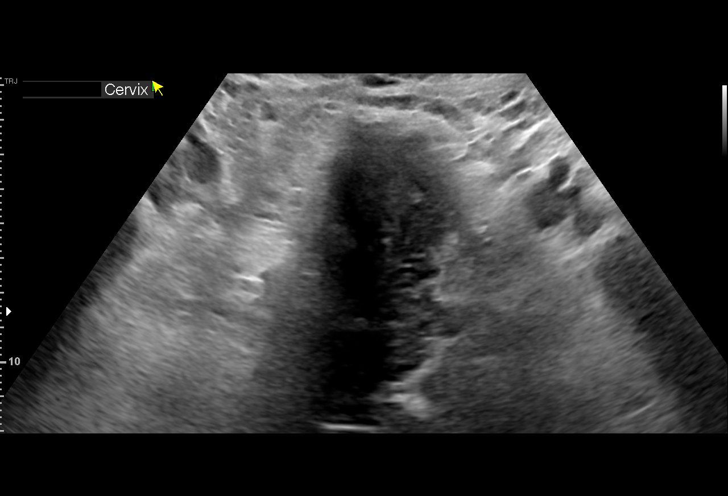
[im 15/64]
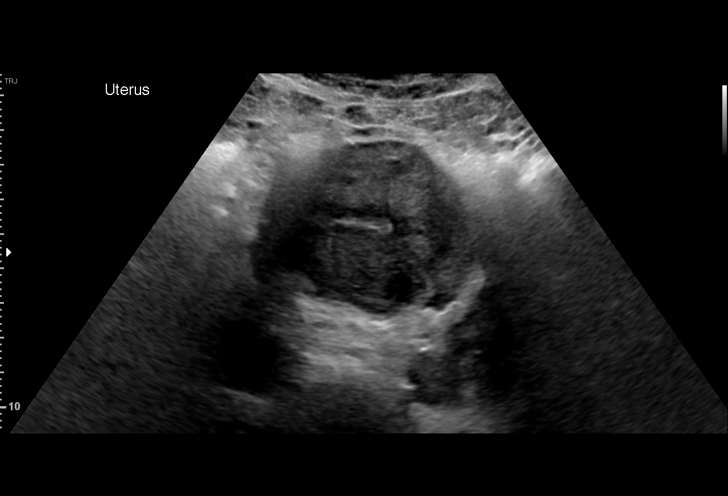
[im 19/64]
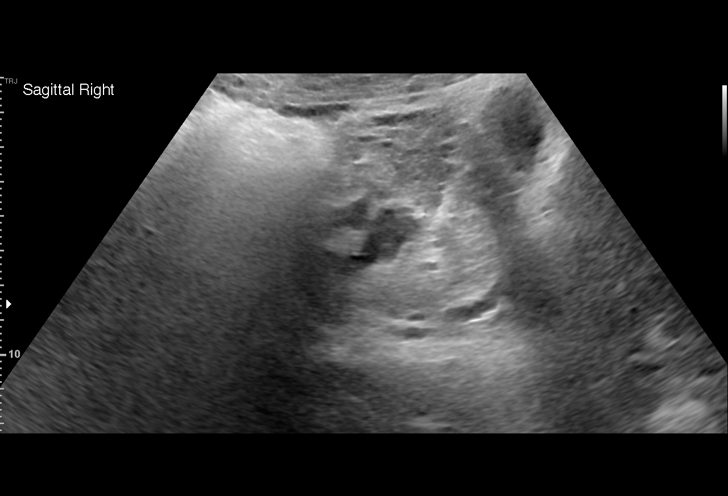
[im 24/64]
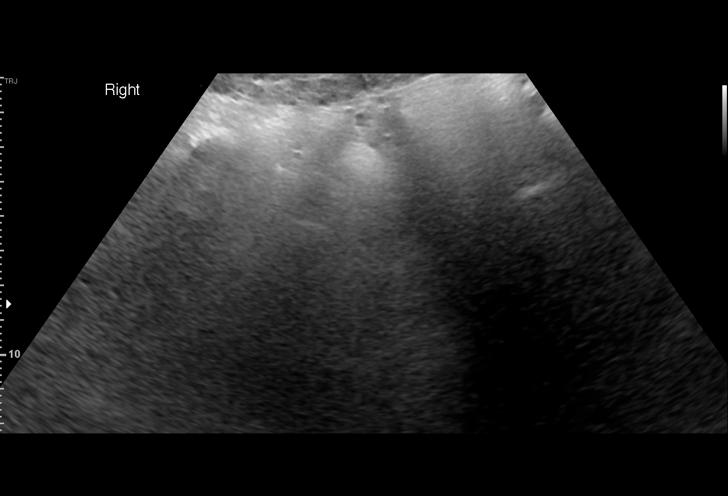
[im 29/64]
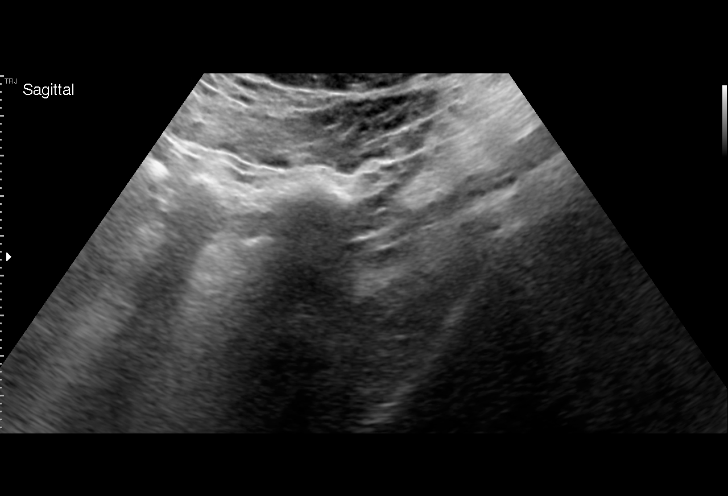
[im 33/64]
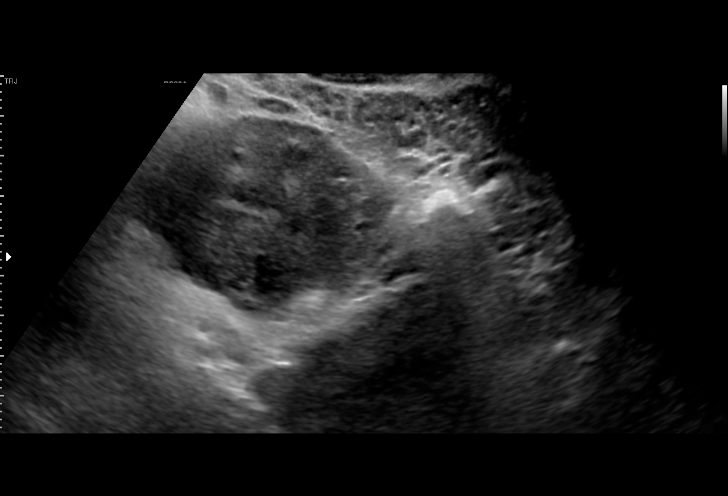
[im 36/64]
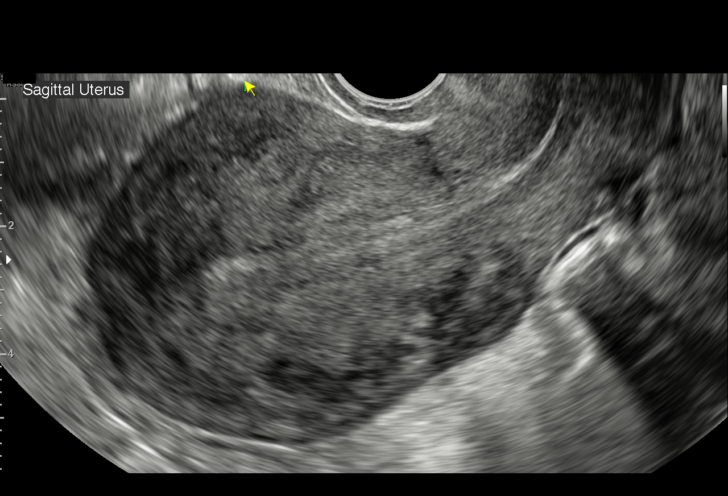
[im 40/64]
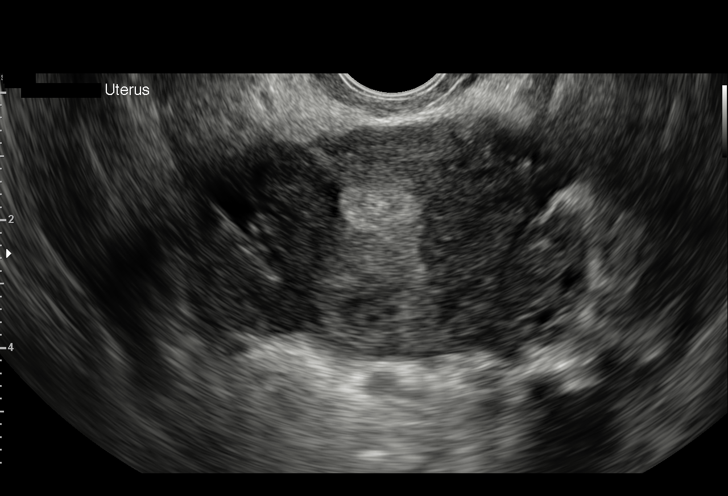
[im 45/64]
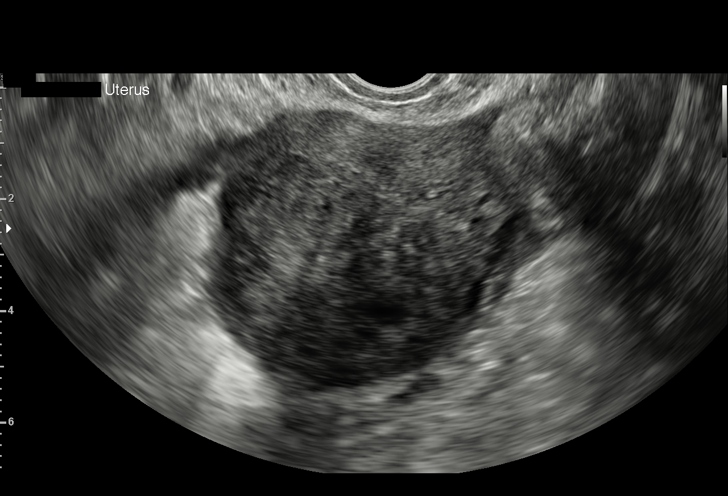
[im 50/64]
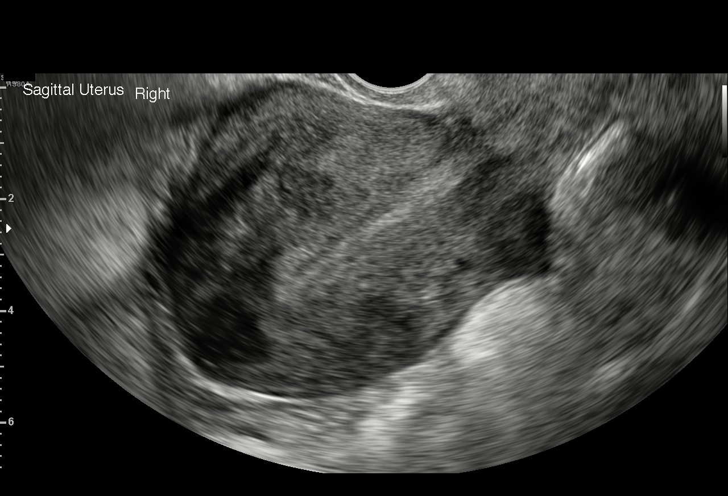
[im 54/64]
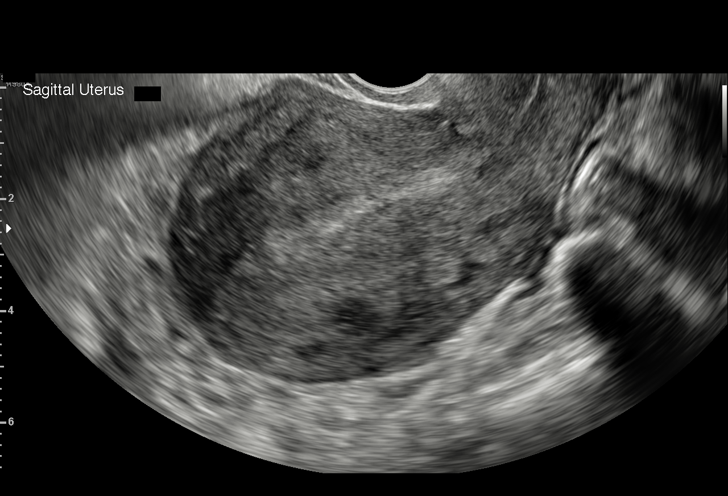
[im 59/64]
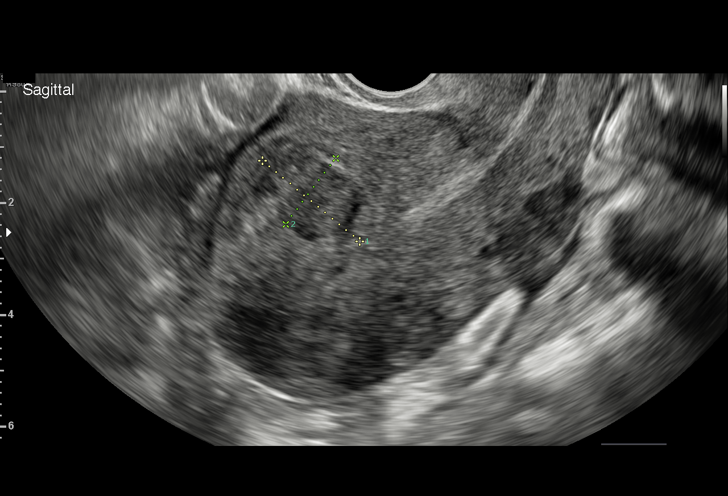
[im 64/64]
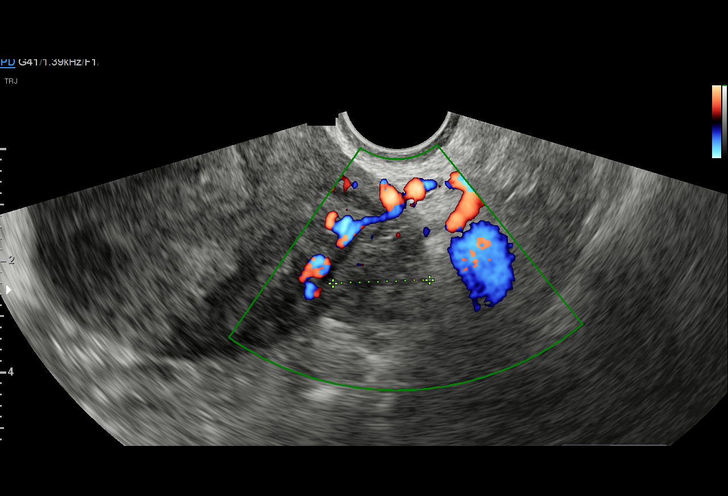

[15 of 28 positions shown; findings below may reference images not displayed]

FINDINGS: Intrauterine gestational sac: None

Yolk sac:  Not Visualized.

Embryo:  Not Visualized.

Cardiac Activity: Not Visualized.

Subchorionic hemorrhage:  None visualized.

Maternal uterus/adnexae:

Right ovary: Normal

Left ovary: Normal

Other :Endometrial thickness measures 9.4 mm. At least 5 fibroids
are identified. The largest is intramural and is in the anterior
fundus measuring 2.3 x 1.5 x 1.7 cm.

Free fluid:  None
IMPRESSION: 1. No intrauterine gestational sac, yolk sac, or fetal pole
identified. Differential considerations include intrauterine
pregnancy too early to be sonographically visualized, missed
abortion, or ectopic pregnancy. Followup ultrasound is recommended
in 10-14 days for further evaluation.
2. Uterine fibroids.

## 2020-04-30 MED ORDER — LAMOTRIGINE 100 MG PO TABS: ORAL_TABLET | ORAL | 1 refills | Status: DC

## 2020-05-22 ENCOUNTER — Telehealth (INDEPENDENT_AMBULATORY_CARE_PROVIDER_SITE_OTHER): Payer: Self-pay | Admitting: Neurology

## 2020-05-22 ENCOUNTER — Encounter: Payer: Self-pay | Admitting: Neurology

## 2020-05-22 ENCOUNTER — Other Ambulatory Visit: Payer: Self-pay

## 2020-05-22 VITALS — Ht 59.0 in | Wt 211.0 lb

## 2020-05-22 DIAGNOSIS — G40009 Localization-related (focal) (partial) idiopathic epilepsy and epileptic syndromes with seizures of localized onset, not intractable, without status epilepticus: Secondary | ICD-10-CM

## 2020-05-22 NOTE — Progress Notes (Signed)
Virtual Visit via Video Note The purpose of this virtual visit is to provide medical care while limiting exposure to the novel coronavirus.    Consent was obtained for video visit:  Yes.   Answered questions that patient had about telehealth interaction:  Yes.   I discussed the limitations, risks, security and privacy concerns of performing an evaluation and management service by telemedicine. I also discussed with the patient that there may be a patient responsible charge related to this service. The patient expressed understanding and agreed to proceed.  Pt location: Home Physician Location: office Name of referring provider:  No ref. provider found I connected with Anne Benjamin at patients initiation/request on 05/22/2020 at  8:30 AM EST by video enabled telemedicine application and verified that I am speaking with the correct person using two identifiers. Pt MRN:  [id number] Pt DOB:  1981/01/14 Video Participants:  Anne Benjamin   History of Present Illness:  The patient had a virtual video visit on 05/22/2020. 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 Since her last visit, she has been doing well, seizure-free since 06/26/19 (during her pregnancy). At that time Lamotrigine was increased to 300mg  BID, her baby is now 23 months old, she continues on same dose with no seizures or seizure-like symptoms, no side effects. She denies any staring/unresponsive episodes, gaps in time, olfactory/gustatory hallucinations, focal numbness/tingling/weakness, myoclonic jerks. No headaches, dizziness, vision changes, no falls. Sleep and mood are good. No pregnancy plans. She is asking about vitamin supplements called Thrive to help with energy and weight loss.   HPI: This is a pleasant 40 yo RH woman with seizures since 09/01/2012. She was at home when her husband heard her fall on the floor and found her shaking with all extremities extended. She had told him she felt tired  that day after being up for 72 hours studying and finishing a paper for school. She woke up with EMS around her, feeling tired, no focal weakness. Per EMS report, she had a post-ictal phase of approximately 30 minutes. CBC showed a WBC of 10.7, BMP unremarkable. She had seen neurologist Dr. Jannifer Franklin and routine EEG done showed intermittent dysrhythmic theta slowing from the left mid-temporal region, with occasional sharp transients. At no time did there appear to be evidence of spike or spike wave discharges. She was unable to do MRI brain ordered. She was started on Keppra 500mg  BID, however she felt very sleepy and unable to function on this, and had self-reduced dose to 250mg  daily. She had been on this dose and had another seizure that occurred out of sleep in June/July 2015. Her husband heard her shaking, with urinary incontinence and tongue bite. She may have been sleep-deprived that time. She did not seek medical attention. On 02/24/14, she was asleep at 4am when her husband woke up to her having a convulsion that lasted 30 seconds. She went back to sleep. He came back in the room at 10am and she recalls asking him if she had another seizure, he reported that she looked like she had another one and there was a wet spot on her pajamas. Around noon, he heard a noise but thought it was a dog outside. Then at 3:30pm, he was in the room and again heard the same noise and found her having another convulsion. She was brought to [facility] where she was noted to be febrile with rectal temperature of 101.3. Her WBC was 17.2. She had a head  CT which I personally reviewed which was normal. She had a lumbar puncture which was traumatic with 700 RBC pink hazy CSF, 2 WBC, protein 13, glucose 85, gram stain and culture, VDRL, HSV, and Cryptococcal Ab negative. She was started on Dilantin, last level 8.0, discharged home on Dilantin 100mg  in AM, 150mg  at 3pm, 100mg  qhs which also caused drowsiness. She was started on Lamictal in  October 2015.   She had a breakthrough seizure last 05/30/16 after being seizure-free for 2 years on Lamotrigine 100mg  BID with no side effects. She was at work then recalls feeling extremely nauseated. She had a bad night of sleep the night prior due to significant menstrual cramps, which is unusual for her. She denied any infection, no missed medication.   Her husband had noticed infrequent blanking out episodes lasting 10 seconds a year ago, none recently. She denies any gaps in time, no olfactory/gustatory hallucinations, deja vu, rising epigastric sensation, focal numbness/tingling/weakness, myoclonic jerks.   Epilepsy Risk Factors: Her paternal half-sister has seizures. She had 2 concussions in her 34s, one with loss of consciousness. Otherwise she had a normal birth and early development. There is no history of febrile convulsions, CNS infections such as meningitis/encephalitis, significant traumatic brain injury, neurosurgical procedures.  Prior AEDs: Keppra, Dilantin  Lamictal level 06/05/17 on LTG 150mg  BID: 6.5   Current Outpatient Medications on File Prior to Visit  Medication Sig Dispense Refill  . ibuprofen (ADVIL) 800 MG tablet Take 1 tablet (800 mg total) by mouth every 6 (six) hours. 30 tablet 0  . lamoTRIgine (LAMICTAL) 100 MG tablet Take 3 tablets twice a day 540 tablet 1  . NIFEdipine (PROCARDIA-XL/NIFEDICAL-XL) 30 MG 24 hr tablet Take 1 tablet (30 mg total) by mouth daily. 30 tablet 0   No current facility-administered medications on file prior to visit.     Observations/Objective:   GEN:  The patient appears stated age and is in NAD.  Neurological examination: Patient is awake, alert, oriented x 3. No aphasia or dysarthria. Intact fluency and comprehension. Remote and recent memory intact. Able to name and repeat. Cranial nerves: Extraocular movements intact with no nystagmus. No facial asymmetry. Motor: moves all extremities symmetrically, at least anti-gravity x 4. No  incoordination on finger to nose testing. Gait: narrow-based and steady, able to tandem walk adequately. Negative Romberg test.   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 Routine EEG in 2014 had shown left temporal slowing, MRI brain normal. Her last seizure was in 06/2019 during her pregnancy. She has been on Lamotrigine 300mg  BID since then with no side effects. She is asking about a vitamin supplement called Thrive and will send information. She is aware of [place] driving laws to stop driving after a seizure until 6 months seizure-free. Follow-up in 8 months, she knows to call for any changes.    Follow Up Instructions:   -I discussed the assessment and treatment plan with the patient. The patient was provided an opportunity to ask questions and all were answered. The patient agreed with the plan and demonstrated an understanding of the instructions.   The patient was advised to call back or seek an in-person evaluation if the symptoms worsen or if the condition fails to improve as anticipated.     Cameron Sprang, MD

## 2020-07-03 MED ORDER — LAMOTRIGINE 100 MG PO TABS: ORAL_TABLET | ORAL | 2 refills | Status: DC

## 2020-10-10 ENCOUNTER — Encounter: Payer: Self-pay | Admitting: Obstetrics and Gynecology

## 2020-10-10 ENCOUNTER — Ambulatory Visit: Payer: Self-pay | Admitting: Obstetrics and Gynecology

## 2020-10-11 NOTE — Progress Notes (Signed)
Patient did not keep her GYN appointment for 10/10/2020.  Durene Romans MD Attending Center for [employer] [facility])

## 2021-01-26 ENCOUNTER — Other Ambulatory Visit: Payer: Self-pay

## 2021-01-26 ENCOUNTER — Encounter: Payer: Self-pay | Admitting: Neurology

## 2021-01-26 ENCOUNTER — Telehealth (INDEPENDENT_AMBULATORY_CARE_PROVIDER_SITE_OTHER): Payer: Self-pay | Admitting: Neurology

## 2021-01-26 VITALS — Ht 59.0 in | Wt 195.0 lb

## 2021-01-26 DIAGNOSIS — G40009 Localization-related (focal) (partial) idiopathic epilepsy and epileptic syndromes with seizures of localized onset, not intractable, without status epilepticus: Secondary | ICD-10-CM

## 2021-01-26 MED ORDER — LAMOTRIGINE 100 MG PO TABS: ORAL_TABLET | ORAL | 3 refills | Status: DC

## 2021-01-26 NOTE — Progress Notes (Signed)
Virtual Visit via Video Note The purpose of this virtual visit is to provide medical care while limiting exposure to the novel coronavirus.    Consent was obtained for video visit:  Yes.   Answered questions that patient had about telehealth interaction:  Yes.   I discussed the limitations, risks, security and privacy concerns of performing an evaluation and management service by telemedicine. I also discussed with the patient that there may be a patient responsible charge related to this service. The patient expressed understanding and agreed to proceed.  Pt location: Home Physician Location: office Name of referring provider:  No ref. provider found I connected with Anne Benjamin at patients initiation/request on 01/26/2021 at  8:30 AM EDT by video enabled telemedicine application and verified that I am speaking with the correct person using two identifiers. Pt MRN:  [id number] Pt DOB:  12-30-1980 Video Participants:  Anne Benjamin   History of Present Illness:  The patient had a virtual video visit on 01/26/2021. 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 She continues to do well seizure-free since 06/26/2019 (during her pregnancy). At that time, Lamotrigine was increased to 300mg  BID, which she is tolerating without side effects. She has been under a lot of stress caring for her mother, travelling back and forth to [place], then recently her husband's health issues. Thankfully there have been no seizures. She denies any staring/unresponsive episodes, gaps in time, olfactory/gustatory hallucinations, focal numbness/tingling/weakness, myoclonic jerks. No headaches, dizziness, vision changes, no falls. She is taking an effort to sleep more. No pregnancy plans, they use the barrier method.    HPI: This is a pleasant 40 yo RH woman with seizures since 09/01/2012. She was at home when her husband heard her fall on the floor and found her shaking with all extremities  extended. She had told him she felt tired that day after being up for 72 hours studying and finishing a paper for school. She woke up with EMS around her, feeling tired, no focal weakness. Per EMS report, she had a post-ictal phase of approximately 30 minutes. CBC showed a WBC of 10.7, BMP unremarkable. She had seen neurologist Dr. Jannifer Benjamin and routine EEG done showed intermittent dysrhythmic theta slowing from the left mid-temporal region, with occasional sharp transients. At no time did there appear to be evidence of spike or spike wave discharges. She was unable to do MRI brain ordered. She was started on Keppra 500mg  BID, however she felt very sleepy and unable to function on this, and had self-reduced dose to 250mg  daily. She had been on this dose and had another seizure that occurred out of sleep in June/July 2015. Her husband heard her shaking, with urinary incontinence and tongue bite. She may have been sleep-deprived that time. She did not seek medical attention. On 02/24/14, she was asleep at 4am when her husband woke up to her having a convulsion that lasted 30 seconds. She went back to sleep. He came back in the room at 10am and she recalls asking him if she had another seizure, he reported that she looked like she had another one and there was a wet spot on her pajamas. Around noon, he heard a noise but thought it was a dog outside. Then at 3:30pm, he was in the room and again heard the same noise and found her having another convulsion. She was brought to [facility] where she was noted to be febrile with rectal temperature of 101.3. Her WBC  was 17.2. She had a head CT which I personally reviewed which was normal. She had a lumbar puncture which was traumatic with 700 RBC pink hazy CSF, 2 WBC, protein 13, glucose 85, gram stain and culture, VDRL, HSV, and Cryptococcal Ab negative. She was started on Dilantin, last level 8.0, discharged home on Dilantin 100mg  in AM, 150mg  at 3pm, 100mg  qhs which also caused  drowsiness. She was started on Lamictal in October 2015.   She had a breakthrough seizure last 05/30/16 after being seizure-free for 2 years on Lamotrigine 100mg  BID with no side effects. She was at work then recalls feeling extremely nauseated. She had a bad night of sleep the night prior due to significant menstrual cramps, which is unusual for her. She denied any infection, no missed medication.   Her husband had noticed infrequent blanking out episodes lasting 10 seconds a year ago, none recently. She denies any gaps in time, no olfactory/gustatory hallucinations, deja vu, rising epigastric sensation, focal numbness/tingling/weakness, myoclonic jerks.   Epilepsy Risk Factors:  Her paternal half-sister has seizures. She had 2 concussions in her 109s, one with loss of consciousness. Otherwise she had a normal birth and early development.  There is no history of febrile convulsions, CNS infections such as meningitis/encephalitis, significant traumatic brain injury, neurosurgical procedures.  Prior AEDs: Keppra, Dilantin  Lamictal level 06/05/17 on LTG 150mg  BID: 6.5     Current Outpatient Medications on File Prior to Visit  Medication Sig Dispense Refill   ibuprofen (ADVIL) 800 MG tablet Take 1 tablet (800 mg total) by mouth every 6 (six) hours. 30 tablet 0   lamoTRIgine (LAMICTAL) 100 MG tablet Take 3 tablets twice a day 540 tablet 2   No current facility-administered medications on file prior to visit.     Observations/Objective:   Vitals:   01/26/21 0817  Weight: 195 lb (88.5 kg)  Height: 4\' 11"  (1.499 m)   GEN:  The patient appears stated age and is in NAD.  Neurological examination: Patient is awake, alert. No aphasia or dysarthria. Intact fluency and comprehension. Remote and recent memory intact.  Cranial nerves: Extraocular movements intact with no nystagmus. No facial asymmetry. Motor: moves all extremities symmetrically, at least anti-gravity x 4.    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 Routine EEG in 2014 had shown left temporal slowing, MRI brain normal. She continues to be seizure-free since 06/2019 on Lamotrigine 300mg  BID without side effects. She knows to avoid seizure triggers. She is aware of [place] driving laws to stop driving after a seizure until 6 months seizure-free. Follow-up in 1 year, call for any changes.    Follow Up Instructions:   -I discussed the assessment and treatment plan with the patient. The patient was provided an opportunity to ask questions and all were answered. The patient agreed with the plan and demonstrated an understanding of the instructions.   The patient was advised to call back or seek an in-person evaluation if the symptoms worsen or if the condition fails to improve as anticipated.    Cameron Sprang, MD

## 2021-01-26 NOTE — Patient Instructions (Signed)
Good to see you. Continue Lamotrigine 300mg  twice a day. Wishing you and your family well. Follow-up in 1 year, call for any changes.   Seizure Precautions: 1. If medication has been prescribed for you to prevent seizures, take it exactly as directed.  Do not stop taking the medicine without talking to your doctor first, even if you have not had a seizure in a long time.   2. Avoid activities in which a seizure would cause danger to yourself or to others.  Don't operate dangerous machinery, swim alone, or climb in high or dangerous places, such as on ladders, roofs, or girders.  Do not drive unless your doctor says you may.  3. If you have any warning that you may have a seizure, lay down in a safe place where you can't hurt yourself.    4.  No driving for 6 months from last seizure, as per [facility] [address].   Please refer to the following link on the [place] website for more information: http://www.epilepsyfoundation.org/answerplace/Social/driving/drivingu.cfm   5.  Maintain good sleep hygiene. Avoid alcohol  6.  Notify your neurology if you are planning pregnancy or if you become pregnant.  7.  Contact your doctor if you have any problems that may be related to the medicine you are taking.  8.  Call 911 and bring the patient back to the ED if:        A.  The seizure lasts longer than 5 minutes.       B.  The patient doesn't awaken shortly after the seizure  C.  The patient has new problems such as difficulty seeing, speaking or moving  D.  The patient was injured during the seizure  E.  The patient has a temperature over 102 F (39C)  F.  The patient vomited and now is having trouble breathing

## 2021-03-22 IMAGING — US US MFM FETAL NUCHAL TRANSLUCENCY
1 series · 14 of 28 positions shown · non-contrast
Comparison: none

[Series 1: us mfm fetal nuchal translucency · 14 of 52 slices shown]
[im 2/52]
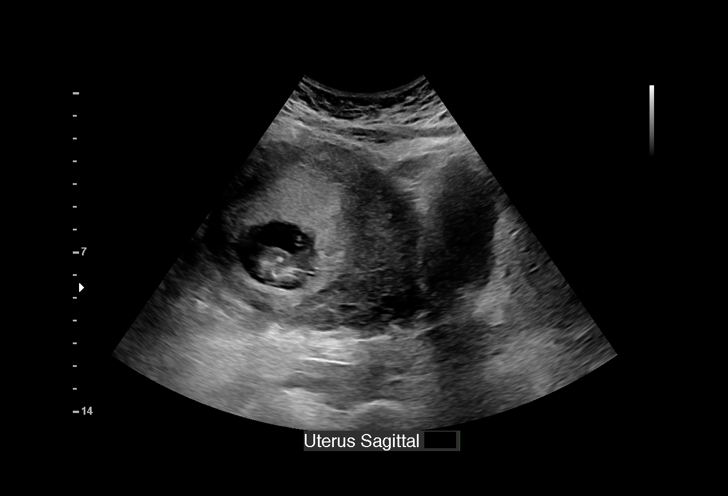
[im 6/52]
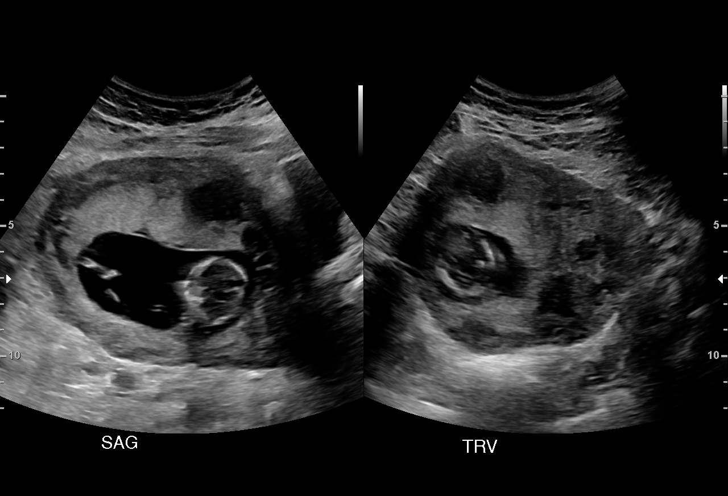
[im 10/52]
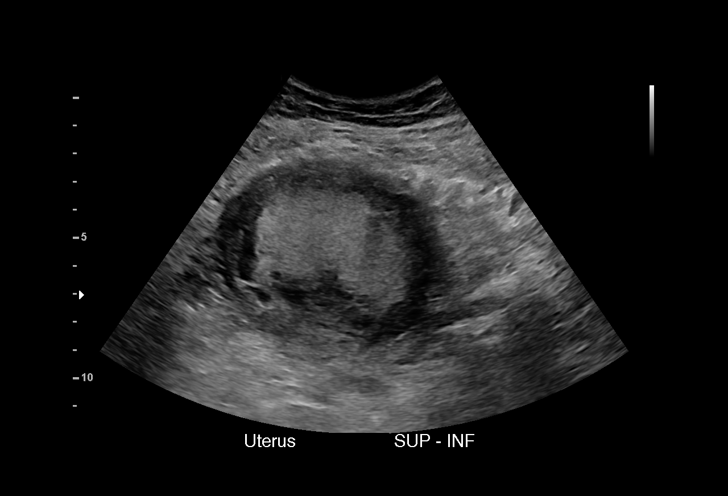
[im 14/52]
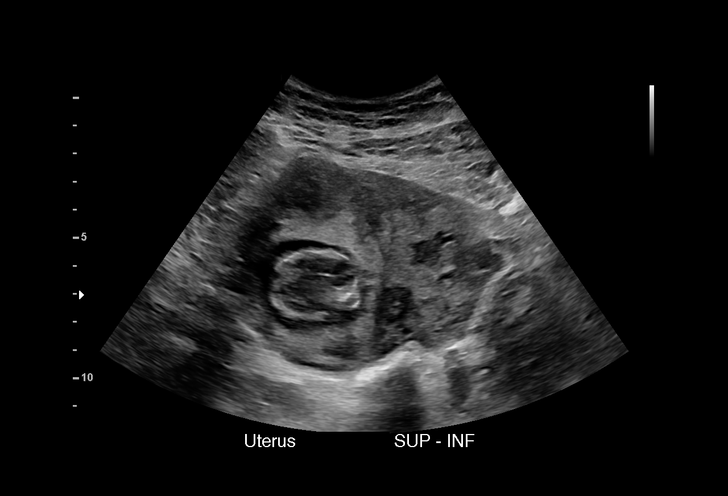
[im 18/52]
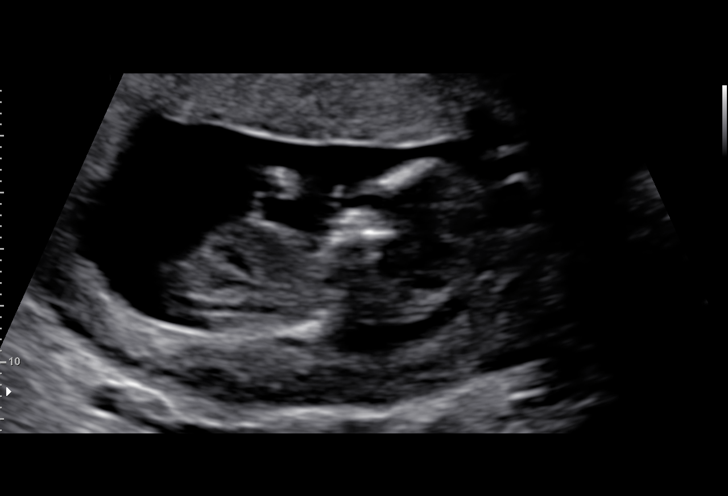
[im 21/52]
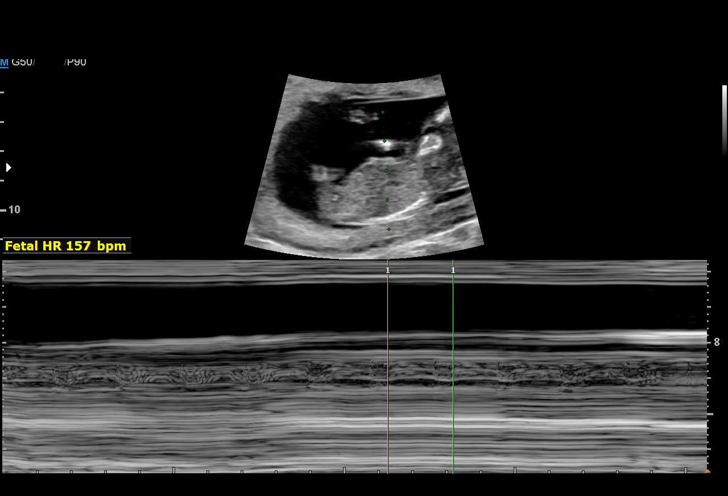
[im 25/52]
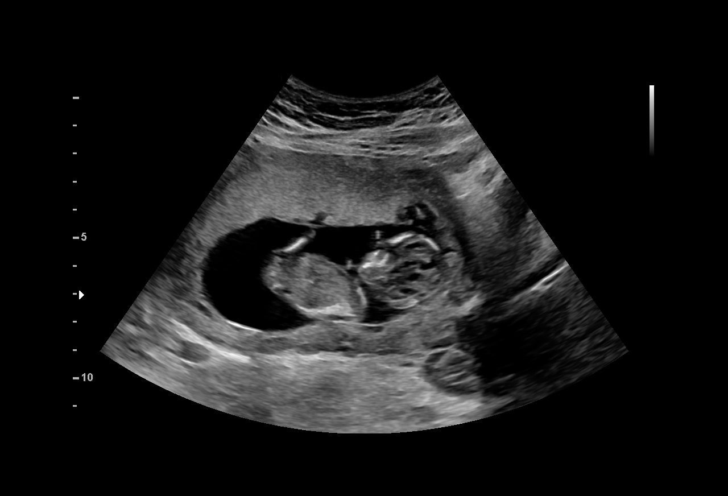
[im 29/52]
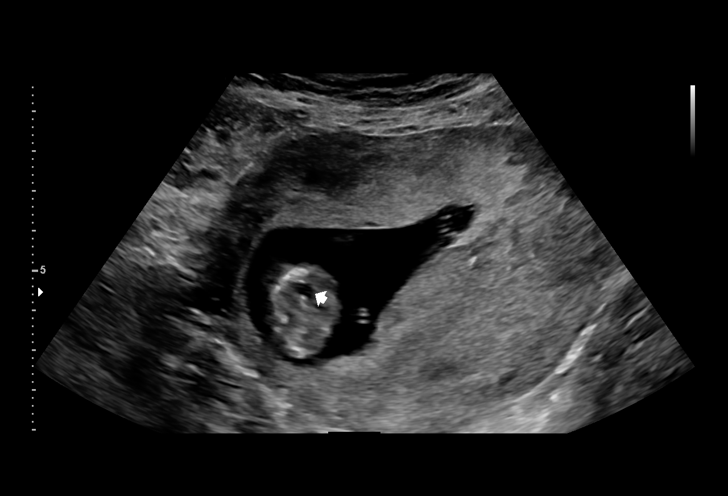
[im 33/52]
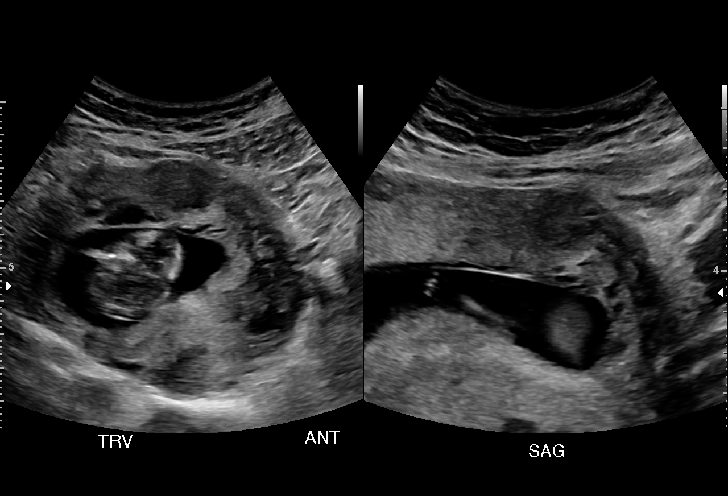
[im 36/52]
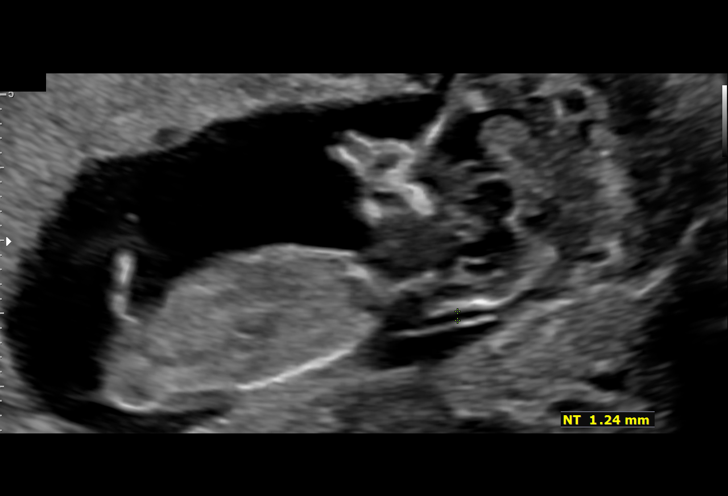
[im 40/52]
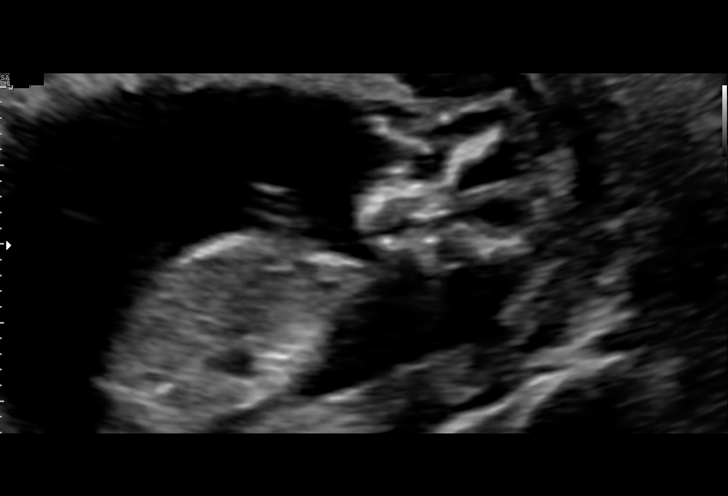
[im 44/52]
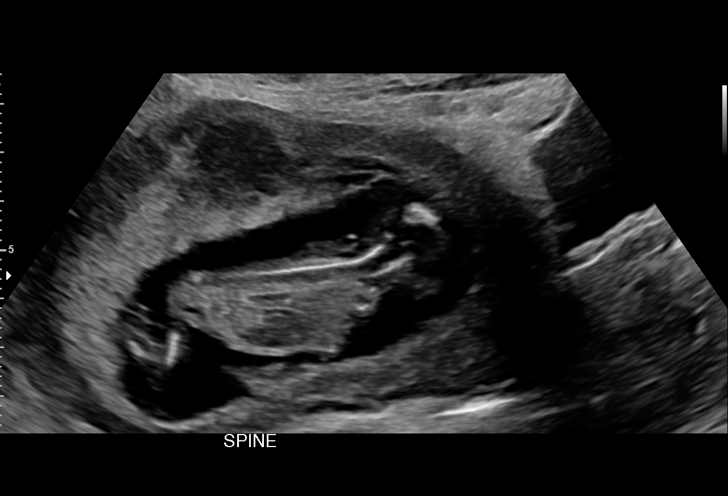
[im 48/52]
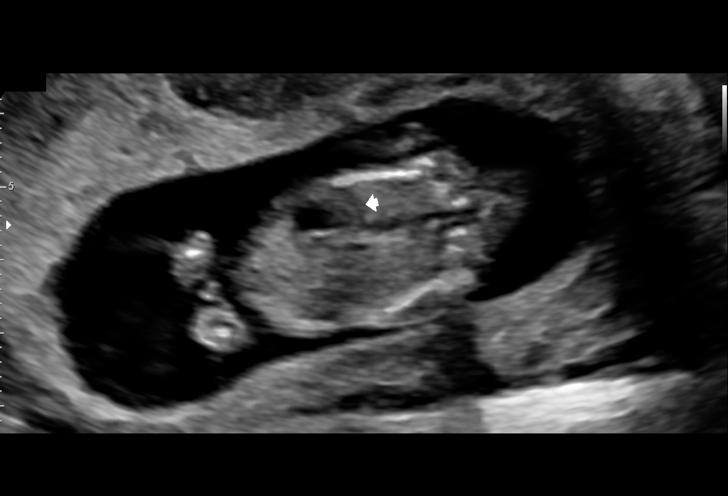
[im 52/52]
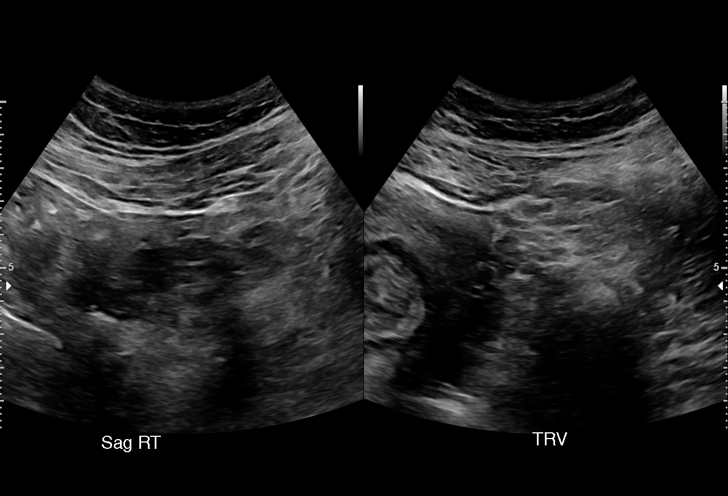

[14 of 28 positions shown; findings below may reference images not displayed]

TRANSLUCENCY
 ----------------------------------------------------------------------

 ----------------------------------------------------------------------
Indications

  Maternal morbid obesity (BMI 43)
  Previous cesarean delivery, antepartum
  13 weeks gestation of pregnancy
 ----------------------------------------------------------------------
Vital Signs

 BMI:
Fetal Evaluation

 Num Of Fetuses:         1
 Fetal Heart Rate(bpm):  157
 Cardiac Activity:       Observed
 Presentation:           Variable
 Placenta:               Anterior
Biometry

 CRL:      69.4  mm     G. Age:  13w 0d                  EDD:   08/02/19
 NT:       1.24  mm
OB History

 Gravidity:    3         Term:   1         SAB:   1
 Living:       1
Gestational Age
 LMP:           13w 0d        Date:  10/26/18                 EDD:   08/02/19
 Best:          13w 0d     Det. By:  Previous Ultrasound      EDD:   08/02/19
                                     (01/12/19)
Anatomy

 Diaphragm:             Appears normal         Bladder:                Appears normal
 Stomach:               Appears normal, left
                        sided
Comments

 This patient was seen for a nuchal translucency
 measurement and first trimester ultrasound due to advanced
 maternal age and maternal obesity.  The patient reports a
 history of a seizure disorder that is currently treated with
 Lamictal.
 The patient reports that she already had a cell free DNA test
 earlier in her pregnancy which indicated a low risk for trisomy
 21, 18, and 13.  A male fetus is predicted.
 The crown-rump length measured today is consistent with her
 gestational age.  The nuchal translucency measurement was
 1.2 mm.  This is a normal measurement.
 Multiple fibroids were noted throughout her uterus today.  The
 increased risk of possible fetal growth issues later in her
 pregnancy and also with maternal pain issues associated
 with fibroids in pregnancy was discussed today.
 The patient should have a fetal anatomy scan performed at
 around 19 weeks.  Due to the fibroid uterus, she should then
 be followed with serial growth ultrasounds.

## 2021-08-02 IMAGING — US US MFM OB FOLLOW-UP
1 series · 13 of 28 positions shown · non-contrast
Comparison: none

[Series 1: us mfm ob follow-up · 13 of 30 slices shown]
[im 2/30]
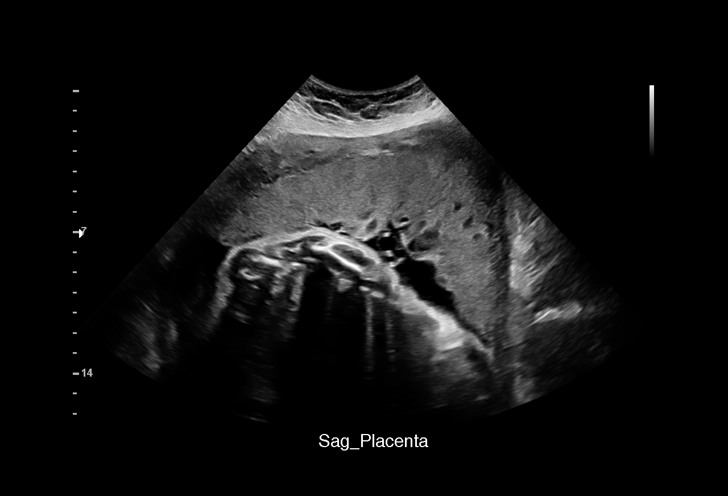
[im 4/30]
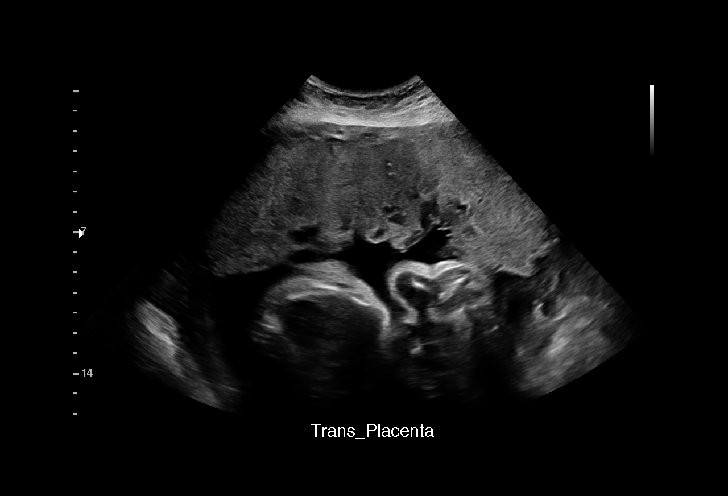
[im 6/30]
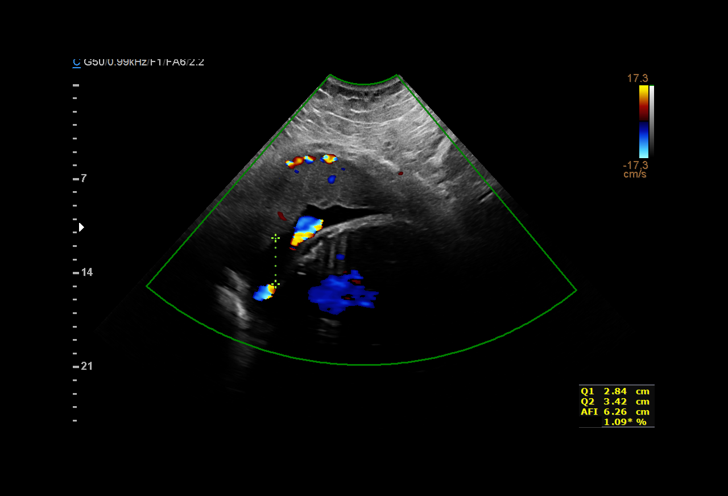
[im 8/30]
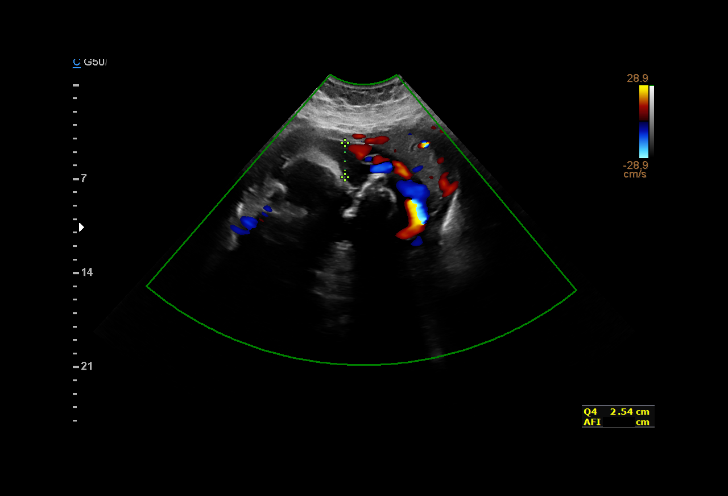
[im 10/30]
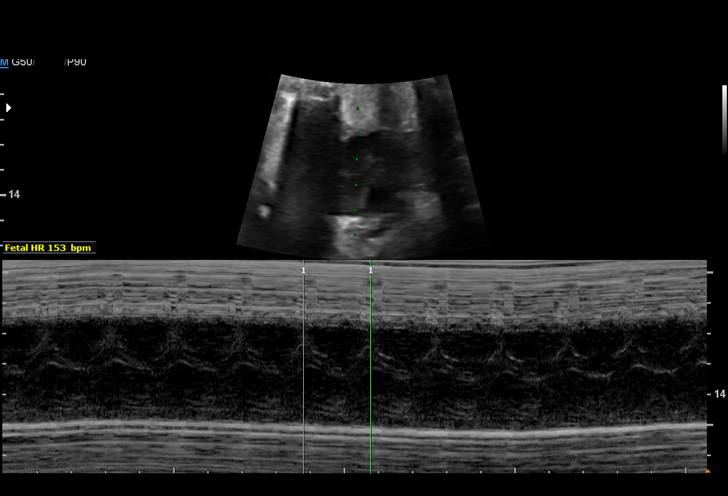
[im 12/30]
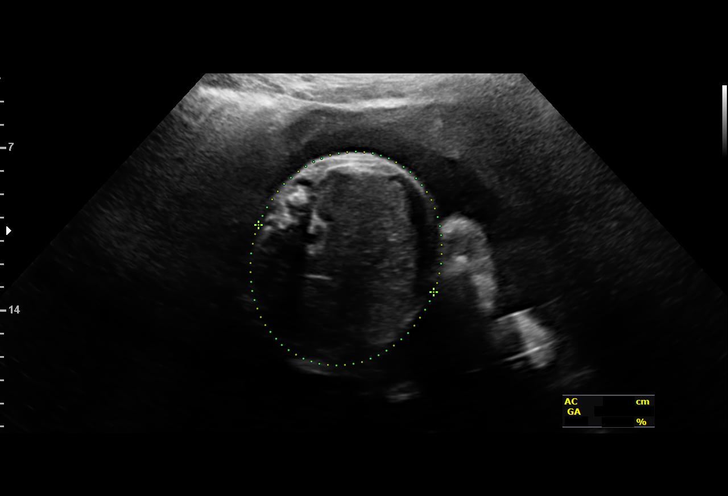
[im 16/30]
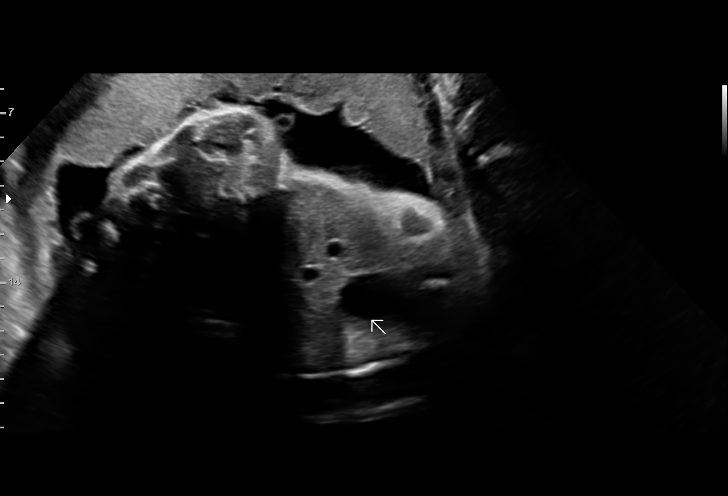
[im 18/30]
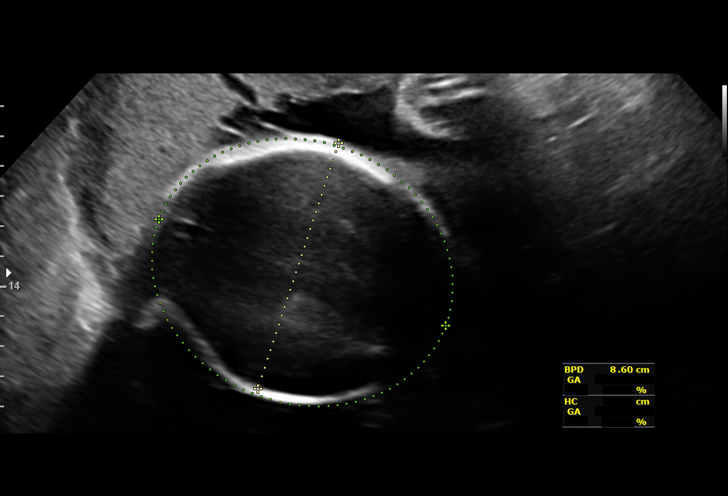
[im 20/30]
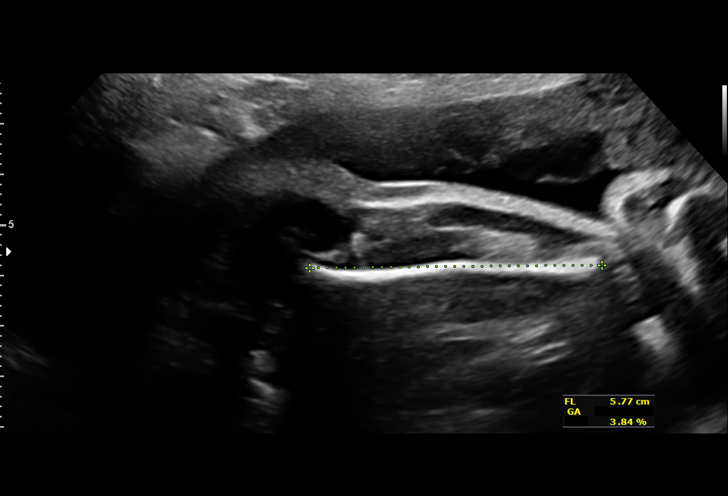
[im 22/30]
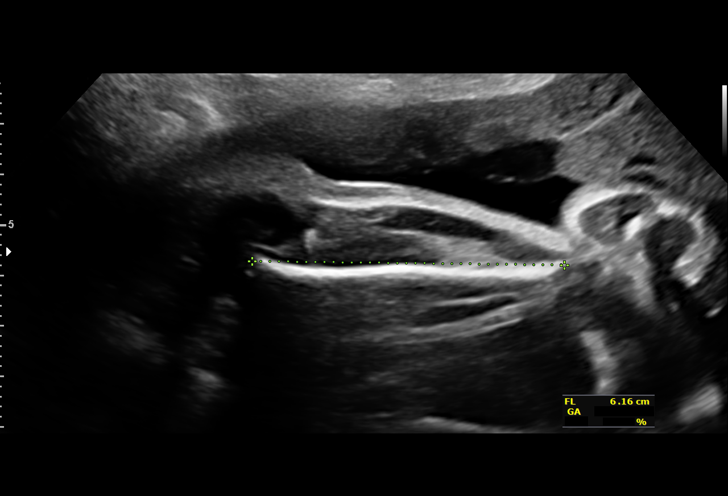
[im 24/30]
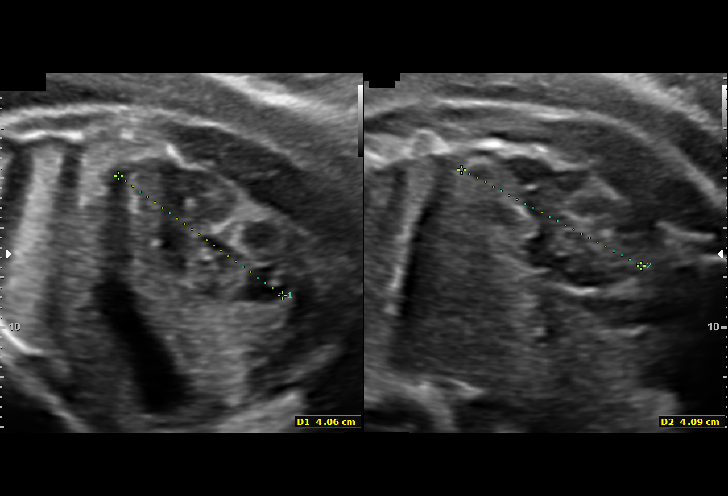
[im 26/30]
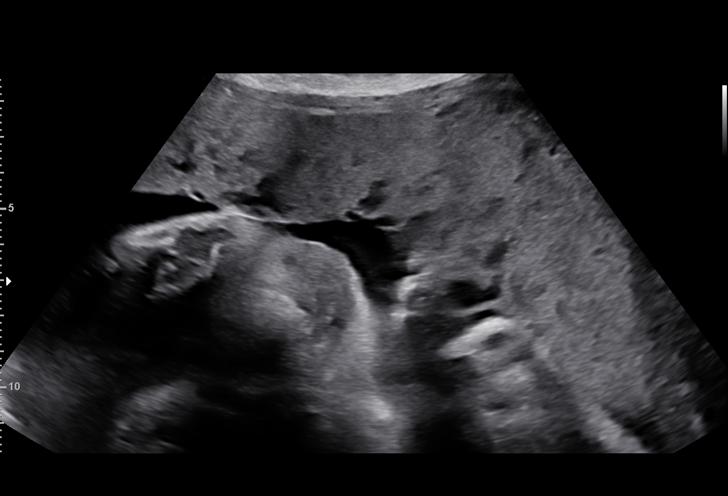
[im 28/30]
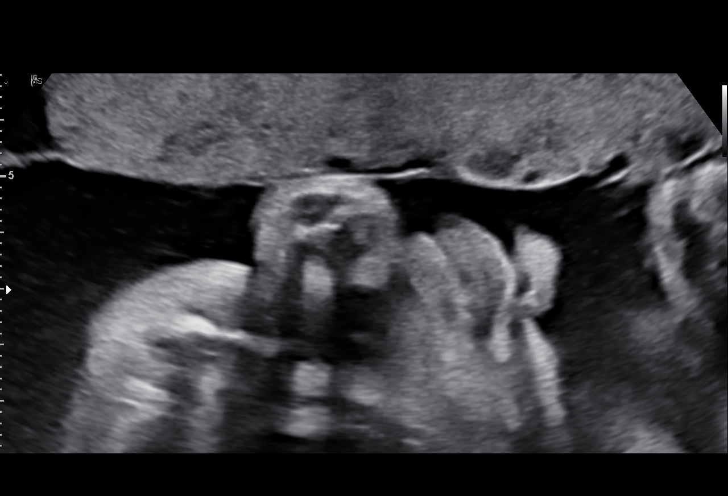

[13 of 28 positions shown; findings below may reference images not displayed]

----------------------------------------------------------------------

 ----------------------------------------------------------------------
Indications

  Maternal morbid obesity (BMI 43)
  Seizure disorder
  Uterine fibroids
  Previous cesarean delivery, antepartum x1
  Low Risk NIPS
  Genetic carrier (Alpha Thalassemia Silent
  Carrier)
  Encounter for other antenatal screening
  follow-up
  Advanced maternal age multigravida 35+,
  third trimester (38)
  32 weeks gestation of pregnancy
 ----------------------------------------------------------------------
Vital Signs

                                                Height:        4'11"
Fetal Evaluation

 Num Of Fetuses:         1
 Fetal Heart Rate(bpm):  167
 Cardiac Activity:       Observed
 Presentation:           Variable
 Placenta:               Anterior
 P. Cord Insertion:      Visualized

 Amniotic Fluid
 AFI FV:      Within normal limits

 AFI Sum(cm)     %Tile       Largest Pocket(cm)
 12.06           32
 RUQ(cm)       RLQ(cm)       LUQ(cm)        LLQ(cm)

Biometry

 BPD:      84.8  mm     G. Age:  34w 1d         93  %    CI:        79.03   %    70 - 86
                                                         FL/HC:      19.8   %    19.1 -
 HC:      301.6  mm     G. Age:  33w 3d         53  %    HC/AC:      1.07        0.96 -
 AC:      283.1  mm     G. Age:  32w 2d         59  %    FL/BPD:     70.4   %    71 - 87
 FL:       59.7  mm     G. Age:  31w 1d         16  %    FL/AC:      21.1   %    20 - 24
 HUM:      53.8  mm     G. Age:  31w 2d         37  %

 Est. FW:    7627  gm      4 lb 4 oz     47  %
OB History

 Gravidity:    3         Term:   1         SAB:   1
 Living:       1
Gestational Age

 LMP:           32w 0d        Date:  10/26/18                 EDD:   08/02/19
 U/S Today:     32w 5d                                        EDD:   07/28/19
 Best:          32w 0d     Det. By:  Previous Ultrasound      EDD:   08/02/19
                                     (01/12/19)
Anatomy

 Cranium:               Appears normal         Aortic Arch:            Previously seen
 Cavum:                 Appears normal         Ductal Arch:            Previously seen
 Ventricles:            Appears normal         Diaphragm:              Appears normal
 Choroid Plexus:        Previously seen        Stomach:                Appears normal, left
                                                                       sided
 Cerebellum:            Previously seen        Abdomen:                Appears normal
 Posterior Fossa:       Previously seen        Abdominal Wall:         Previously seen
 Nuchal Fold:           Not applicable (>20    Cord Vessels:           Previously seen
                        wks GA)
 Face:                  Orbits and profile     Kidneys:                Appear normal
                        previously seen
 Lips:                  Appears normal         Bladder:                Appears normal
 Thoracic:              Appears normal         Spine:                  Previously seen
 Heart:                 Previously seen        Upper Extremities:      Previously seen
 RVOT:                  Previously seen        Lower Extremities:      Previously seen
 LVOT:                  Previously seen
Comments

 This patient was seen for a follow up growth scan due to
 maternal obesity and a fibroid uterus.  She denies any
 problems since her last exam.
 She was informed that the fetal growth and amniotic fluid
 level appears appropriate for her gestational age.
 A follow up exam was scheduled in 4 weeks.

## 2021-11-23 ENCOUNTER — Telehealth: Payer: Self-pay | Admitting: Neurology

## 2021-11-23 MED ORDER — LAMOTRIGINE 100 MG PO TABS: ORAL_TABLET | ORAL | 0 refills | Status: AC

## 2021-11-23 NOTE — Telephone Encounter (Signed)
Lamotrigine '100mg'$  needs a refill [facility]

## 2021-11-23 NOTE — Telephone Encounter (Signed)
Refills sent in for pt

## 2022-01-28 ENCOUNTER — Ambulatory Visit: Payer: Self-pay | Admitting: Neurology

## 2022-05-22 ENCOUNTER — Encounter: Payer: Self-pay | Admitting: Neurology

## 2022-07-26 ENCOUNTER — Encounter: Payer: Self-pay | Admitting: Neurology

## 2022-07-26 ENCOUNTER — Ambulatory Visit: Payer: Self-pay | Admitting: Neurology

## 2022-07-26 DIAGNOSIS — Z029 Encounter for administrative examinations, unspecified: Secondary | ICD-10-CM

## 2022-08-05 ENCOUNTER — Ambulatory Visit: Payer: Self-pay | Admitting: Neurology
